# Patient Record
Sex: Female | Born: 1940 | Race: White | Hispanic: No | Marital: Married | Smoking: Current every day smoker
Health system: Southern US, Community
[De-identification: ages and names within clinical notes are randomized; demographics above are authoritative.]

## PROBLEM LIST (undated history)

## (undated) ENCOUNTER — Emergency Department (HOSPITAL_COMMUNITY): Admission: EM | Payer: Managed Care, Other (non HMO) | Source: Home / Self Care

## (undated) DIAGNOSIS — A419 Sepsis, unspecified organism: Secondary | ICD-10-CM

## (undated) DIAGNOSIS — Z720 Tobacco use: Secondary | ICD-10-CM

## (undated) DIAGNOSIS — M1990 Unspecified osteoarthritis, unspecified site: Secondary | ICD-10-CM

## (undated) DIAGNOSIS — E785 Hyperlipidemia, unspecified: Secondary | ICD-10-CM

## (undated) DIAGNOSIS — K635 Polyp of colon: Secondary | ICD-10-CM

## (undated) DIAGNOSIS — M549 Dorsalgia, unspecified: Secondary | ICD-10-CM

## (undated) DIAGNOSIS — M797 Fibromyalgia: Secondary | ICD-10-CM

## (undated) DIAGNOSIS — M4800 Spinal stenosis, site unspecified: Secondary | ICD-10-CM

## (undated) DIAGNOSIS — Z87898 Personal history of other specified conditions: Secondary | ICD-10-CM

## (undated) DIAGNOSIS — G8929 Other chronic pain: Secondary | ICD-10-CM

## (undated) DIAGNOSIS — I10 Essential (primary) hypertension: Secondary | ICD-10-CM

## (undated) DIAGNOSIS — I214 Non-ST elevation (NSTEMI) myocardial infarction: Secondary | ICD-10-CM

## (undated) DIAGNOSIS — K449 Diaphragmatic hernia without obstruction or gangrene: Secondary | ICD-10-CM

## (undated) DIAGNOSIS — Z9119 Patient's noncompliance with other medical treatment and regimen: Secondary | ICD-10-CM

## (undated) DIAGNOSIS — I480 Paroxysmal atrial fibrillation: Secondary | ICD-10-CM

## (undated) DIAGNOSIS — R6521 Severe sepsis with septic shock: Secondary | ICD-10-CM

## (undated) DIAGNOSIS — J9600 Acute respiratory failure, unspecified whether with hypoxia or hypercapnia: Secondary | ICD-10-CM

## (undated) DIAGNOSIS — I447 Left bundle-branch block, unspecified: Secondary | ICD-10-CM

## (undated) DIAGNOSIS — J9620 Acute and chronic respiratory failure, unspecified whether with hypoxia or hypercapnia: Secondary | ICD-10-CM

## (undated) DIAGNOSIS — F419 Anxiety disorder, unspecified: Secondary | ICD-10-CM

## (undated) DIAGNOSIS — E039 Hypothyroidism, unspecified: Secondary | ICD-10-CM

## (undated) DIAGNOSIS — K219 Gastro-esophageal reflux disease without esophagitis: Secondary | ICD-10-CM

## (undated) DIAGNOSIS — I428 Other cardiomyopathies: Secondary | ICD-10-CM

## (undated) DIAGNOSIS — F411 Generalized anxiety disorder: Secondary | ICD-10-CM

## (undated) DIAGNOSIS — I4891 Unspecified atrial fibrillation: Secondary | ICD-10-CM

## (undated) DIAGNOSIS — J13 Pneumonia due to Streptococcus pneumoniae: Secondary | ICD-10-CM

## (undated) DIAGNOSIS — J90 Pleural effusion, not elsewhere classified: Secondary | ICD-10-CM

## (undated) DIAGNOSIS — F17200 Nicotine dependence, unspecified, uncomplicated: Secondary | ICD-10-CM

## (undated) DIAGNOSIS — I5023 Acute on chronic systolic (congestive) heart failure: Secondary | ICD-10-CM

## (undated) HISTORY — DX: Personal history of other specified conditions: Z87.898

## (undated) HISTORY — DX: Gastro-esophageal reflux disease without esophagitis: K21.9

## (undated) HISTORY — DX: Diaphragmatic hernia without obstruction or gangrene: K44.9

## (undated) HISTORY — DX: Anxiety disorder, unspecified: F41.9

## (undated) HISTORY — PX: TUBAL LIGATION: SHX77

## (undated) HISTORY — PX: ROTATOR CUFF REPAIR: SHX139

## (undated) HISTORY — DX: Left bundle-branch block, unspecified: I44.7

## (undated) HISTORY — DX: Hyperlipidemia, unspecified: E78.5

## (undated) HISTORY — DX: Spinal stenosis, site unspecified: M48.00

## (undated) HISTORY — PX: PARTIAL HYSTERECTOMY: SHX80

## (undated) HISTORY — PX: LUMBAR LAMINECTOMY/DECOMPRESSION MICRODISCECTOMY: SHX5026

## (undated) HISTORY — DX: Other chronic pain: G89.29

## (undated) HISTORY — DX: Fibromyalgia: M79.7

## (undated) HISTORY — DX: Tobacco use: Z72.0

## (undated) HISTORY — PX: TONSILLECTOMY AND ADENOIDECTOMY: SUR1326

## (undated) HISTORY — DX: Polyp of colon: K63.5

## (undated) HISTORY — PX: OTHER SURGICAL HISTORY: SHX169

## (undated) HISTORY — PX: CERVICAL DISCECTOMY: SHX98

## (undated) HISTORY — DX: Hypothyroidism, unspecified: E03.9

## (undated) HISTORY — DX: Essential (primary) hypertension: I10

## (undated) HISTORY — DX: Unspecified osteoarthritis, unspecified site: M19.90

## (undated) HISTORY — DX: Unspecified atrial fibrillation: I48.91

## (undated) HISTORY — DX: Dorsalgia, unspecified: M54.9

## (undated) HISTORY — PX: TUMOR REMOVAL: SHX12

---

## 1997-12-15 ENCOUNTER — Ambulatory Visit (HOSPITAL_COMMUNITY): Admission: RE | Admit: 1997-12-15 | Discharge: 1997-12-15 | Payer: Self-pay | Admitting: Specialist

## 1998-03-30 ENCOUNTER — Encounter: Payer: Self-pay | Admitting: Emergency Medicine

## 1998-03-30 ENCOUNTER — Observation Stay (HOSPITAL_COMMUNITY): Admission: EM | Admit: 1998-03-30 | Discharge: 1998-04-01 | Payer: Self-pay | Admitting: Emergency Medicine

## 1998-03-31 ENCOUNTER — Encounter: Payer: Self-pay | Admitting: Cardiovascular Disease

## 2000-03-25 ENCOUNTER — Ambulatory Visit (HOSPITAL_COMMUNITY): Admission: RE | Admit: 2000-03-25 | Discharge: 2000-03-25 | Payer: Self-pay | Admitting: Specialist

## 2000-03-25 ENCOUNTER — Encounter: Payer: Self-pay | Admitting: Specialist

## 2000-04-25 HISTORY — PX: TOTAL KNEE ARTHROPLASTY: SHX125

## 2000-06-16 ENCOUNTER — Encounter (INDEPENDENT_AMBULATORY_CARE_PROVIDER_SITE_OTHER): Payer: Self-pay | Admitting: Specialist

## 2000-06-16 ENCOUNTER — Other Ambulatory Visit: Admission: RE | Admit: 2000-06-16 | Discharge: 2000-06-16 | Payer: Self-pay | Admitting: Internal Medicine

## 2000-06-26 ENCOUNTER — Ambulatory Visit (HOSPITAL_COMMUNITY): Admission: RE | Admit: 2000-06-26 | Discharge: 2000-06-26 | Payer: Self-pay | Admitting: Internal Medicine

## 2000-06-26 ENCOUNTER — Encounter: Payer: Self-pay | Admitting: Internal Medicine

## 2000-08-11 ENCOUNTER — Ambulatory Visit (HOSPITAL_COMMUNITY): Admission: RE | Admit: 2000-08-11 | Discharge: 2000-08-11 | Payer: Self-pay | Admitting: Specialist

## 2000-09-25 ENCOUNTER — Encounter: Payer: Self-pay | Admitting: Specialist

## 2000-10-02 ENCOUNTER — Inpatient Hospital Stay (HOSPITAL_COMMUNITY): Admission: RE | Admit: 2000-10-02 | Discharge: 2000-10-06 | Payer: Self-pay | Admitting: Specialist

## 2000-10-02 ENCOUNTER — Encounter: Payer: Self-pay | Admitting: Specialist

## 2000-12-28 ENCOUNTER — Encounter: Payer: Self-pay | Admitting: Family Medicine

## 2000-12-28 ENCOUNTER — Ambulatory Visit (HOSPITAL_COMMUNITY): Admission: RE | Admit: 2000-12-28 | Discharge: 2000-12-28 | Payer: Self-pay | Admitting: Family Medicine

## 2001-02-06 ENCOUNTER — Other Ambulatory Visit: Admission: RE | Admit: 2001-02-06 | Discharge: 2001-02-06 | Payer: Self-pay | Admitting: Family Medicine

## 2001-09-04 ENCOUNTER — Encounter: Admission: RE | Admit: 2001-09-04 | Discharge: 2001-09-04 | Payer: Self-pay | Admitting: Specialist

## 2001-09-04 ENCOUNTER — Encounter: Payer: Self-pay | Admitting: Specialist

## 2001-10-08 ENCOUNTER — Ambulatory Visit (HOSPITAL_COMMUNITY): Admission: RE | Admit: 2001-10-08 | Discharge: 2001-10-08 | Payer: Self-pay | Admitting: Specialist

## 2001-11-21 ENCOUNTER — Encounter: Payer: Self-pay | Admitting: Specialist

## 2001-11-21 ENCOUNTER — Inpatient Hospital Stay (HOSPITAL_COMMUNITY): Admission: RE | Admit: 2001-11-21 | Discharge: 2001-11-24 | Payer: Self-pay | Admitting: Specialist

## 2002-03-11 ENCOUNTER — Encounter: Payer: Self-pay | Admitting: Family Medicine

## 2002-03-11 ENCOUNTER — Ambulatory Visit (HOSPITAL_COMMUNITY): Admission: RE | Admit: 2002-03-11 | Discharge: 2002-03-11 | Payer: Self-pay | Admitting: Family Medicine

## 2002-03-22 ENCOUNTER — Emergency Department (HOSPITAL_COMMUNITY): Admission: EM | Admit: 2002-03-22 | Discharge: 2002-03-23 | Payer: Self-pay | Admitting: *Deleted

## 2002-03-23 ENCOUNTER — Encounter: Payer: Self-pay | Admitting: *Deleted

## 2002-10-25 ENCOUNTER — Encounter: Payer: Self-pay | Admitting: Family Medicine

## 2002-10-25 ENCOUNTER — Encounter: Admission: RE | Admit: 2002-10-25 | Discharge: 2002-10-25 | Payer: Self-pay | Admitting: Family Medicine

## 2003-06-09 ENCOUNTER — Encounter: Admission: RE | Admit: 2003-06-09 | Discharge: 2003-06-09 | Payer: Self-pay | Admitting: Specialist

## 2003-06-26 ENCOUNTER — Encounter: Admission: RE | Admit: 2003-06-26 | Discharge: 2003-06-26 | Payer: Self-pay | Admitting: Specialist

## 2003-09-04 ENCOUNTER — Ambulatory Visit (HOSPITAL_COMMUNITY): Admission: RE | Admit: 2003-09-04 | Discharge: 2003-09-04 | Payer: Self-pay | Admitting: Specialist

## 2003-10-13 ENCOUNTER — Observation Stay (HOSPITAL_COMMUNITY): Admission: RE | Admit: 2003-10-13 | Discharge: 2003-10-14 | Payer: Self-pay | Admitting: Specialist

## 2004-02-25 ENCOUNTER — Emergency Department (HOSPITAL_COMMUNITY): Admission: EM | Admit: 2004-02-25 | Discharge: 2004-02-25 | Payer: Self-pay | Admitting: Emergency Medicine

## 2004-03-11 ENCOUNTER — Ambulatory Visit: Payer: Self-pay | Admitting: Internal Medicine

## 2004-03-12 ENCOUNTER — Inpatient Hospital Stay (HOSPITAL_BASED_OUTPATIENT_CLINIC_OR_DEPARTMENT_OTHER): Admission: RE | Admit: 2004-03-12 | Discharge: 2004-03-12 | Payer: Self-pay | Admitting: Internal Medicine

## 2004-03-12 ENCOUNTER — Ambulatory Visit: Payer: Self-pay | Admitting: Internal Medicine

## 2004-03-24 ENCOUNTER — Ambulatory Visit: Payer: Self-pay | Admitting: Internal Medicine

## 2004-04-22 ENCOUNTER — Ambulatory Visit (HOSPITAL_COMMUNITY): Admission: RE | Admit: 2004-04-22 | Discharge: 2004-04-22 | Payer: Self-pay | Admitting: Family Medicine

## 2004-08-25 ENCOUNTER — Ambulatory Visit (HOSPITAL_COMMUNITY): Admission: RE | Admit: 2004-08-25 | Discharge: 2004-08-25 | Payer: Self-pay | Admitting: Family Medicine

## 2004-10-19 ENCOUNTER — Other Ambulatory Visit: Admission: RE | Admit: 2004-10-19 | Discharge: 2004-10-19 | Payer: Self-pay | Admitting: Family Medicine

## 2005-03-29 ENCOUNTER — Observation Stay (HOSPITAL_COMMUNITY): Admission: RE | Admit: 2005-03-29 | Discharge: 2005-03-30 | Payer: Self-pay | Admitting: Obstetrics & Gynecology

## 2007-01-11 ENCOUNTER — Encounter: Admission: RE | Admit: 2007-01-11 | Discharge: 2007-01-11 | Payer: Self-pay | Admitting: Family Medicine

## 2007-01-16 ENCOUNTER — Encounter: Admission: RE | Admit: 2007-01-16 | Discharge: 2007-01-16 | Payer: Self-pay | Admitting: Family Medicine

## 2007-09-03 ENCOUNTER — Ambulatory Visit: Payer: Self-pay | Admitting: Internal Medicine

## 2007-09-14 ENCOUNTER — Ambulatory Visit: Payer: Self-pay | Admitting: Internal Medicine

## 2009-12-23 ENCOUNTER — Ambulatory Visit: Payer: Self-pay | Admitting: Pulmonary Disease

## 2009-12-23 ENCOUNTER — Ambulatory Visit: Payer: Self-pay | Admitting: Cardiology

## 2009-12-23 ENCOUNTER — Inpatient Hospital Stay (HOSPITAL_COMMUNITY): Admission: EM | Admit: 2009-12-23 | Discharge: 2009-12-31 | Payer: Self-pay | Admitting: Emergency Medicine

## 2009-12-24 ENCOUNTER — Encounter: Payer: Self-pay | Admitting: Cardiology

## 2010-01-07 ENCOUNTER — Ambulatory Visit: Payer: Self-pay | Admitting: Cardiology

## 2010-01-10 LAB — CONVERTED CEMR LAB
BUN: 26 mg/dL — ABNORMAL HIGH (ref 6–23)
CO2: 29 meq/L (ref 19–32)
Chloride: 95 meq/L — ABNORMAL LOW (ref 96–112)
Creatinine, Ser: 1 mg/dL (ref 0.4–1.2)
Eosinophils Absolute: 0.3 10*3/uL (ref 0.0–0.7)
Glucose, Bld: 83 mg/dL (ref 70–99)
MCHC: 33.8 g/dL (ref 30.0–36.0)
MCV: 95.6 fL (ref 78.0–100.0)
Monocytes Absolute: 0.6 10*3/uL (ref 0.1–1.0)
Neutrophils Relative %: 58.1 % (ref 43.0–77.0)
Platelets: 290 10*3/uL (ref 150.0–400.0)
RDW: 15.6 % — ABNORMAL HIGH (ref 11.5–14.6)

## 2010-01-15 DIAGNOSIS — E785 Hyperlipidemia, unspecified: Secondary | ICD-10-CM | POA: Insufficient documentation

## 2010-01-15 DIAGNOSIS — F17200 Nicotine dependence, unspecified, uncomplicated: Secondary | ICD-10-CM

## 2010-01-15 DIAGNOSIS — K219 Gastro-esophageal reflux disease without esophagitis: Secondary | ICD-10-CM

## 2010-01-15 DIAGNOSIS — I480 Paroxysmal atrial fibrillation: Secondary | ICD-10-CM

## 2010-01-15 DIAGNOSIS — I10 Essential (primary) hypertension: Secondary | ICD-10-CM | POA: Insufficient documentation

## 2010-01-15 DIAGNOSIS — M549 Dorsalgia, unspecified: Secondary | ICD-10-CM

## 2010-01-15 DIAGNOSIS — R079 Chest pain, unspecified: Secondary | ICD-10-CM | POA: Insufficient documentation

## 2010-01-15 DIAGNOSIS — E039 Hypothyroidism, unspecified: Secondary | ICD-10-CM | POA: Insufficient documentation

## 2010-01-15 DIAGNOSIS — I447 Left bundle-branch block, unspecified: Secondary | ICD-10-CM

## 2010-01-15 DIAGNOSIS — F411 Generalized anxiety disorder: Secondary | ICD-10-CM

## 2010-01-15 DIAGNOSIS — M1990 Unspecified osteoarthritis, unspecified site: Secondary | ICD-10-CM | POA: Insufficient documentation

## 2010-01-15 HISTORY — DX: Dorsalgia, unspecified: M54.9

## 2010-01-15 HISTORY — DX: Gastro-esophageal reflux disease without esophagitis: K21.9

## 2010-01-15 HISTORY — DX: Left bundle-branch block, unspecified: I44.7

## 2010-01-15 HISTORY — DX: Hyperlipidemia, unspecified: E78.5

## 2010-01-15 HISTORY — DX: Paroxysmal atrial fibrillation: I48.0

## 2010-01-15 HISTORY — DX: Generalized anxiety disorder: F41.1

## 2010-01-15 HISTORY — DX: Nicotine dependence, unspecified, uncomplicated: F17.200

## 2010-01-17 ENCOUNTER — Telehealth: Payer: Self-pay | Admitting: Nurse Practitioner

## 2010-01-20 ENCOUNTER — Ambulatory Visit: Payer: Self-pay | Admitting: Cardiology

## 2010-01-20 DIAGNOSIS — I428 Other cardiomyopathies: Secondary | ICD-10-CM | POA: Insufficient documentation

## 2010-02-15 ENCOUNTER — Telehealth: Payer: Self-pay | Admitting: Cardiology

## 2010-02-17 ENCOUNTER — Ambulatory Visit: Payer: Self-pay | Admitting: Cardiology

## 2010-02-17 DIAGNOSIS — R5381 Other malaise: Secondary | ICD-10-CM | POA: Insufficient documentation

## 2010-02-17 DIAGNOSIS — R5383 Other fatigue: Secondary | ICD-10-CM

## 2010-02-22 ENCOUNTER — Telehealth: Payer: Self-pay | Admitting: Cardiology

## 2010-04-07 ENCOUNTER — Ambulatory Visit: Payer: Self-pay | Admitting: Cardiology

## 2010-05-16 ENCOUNTER — Encounter: Payer: Self-pay | Admitting: Physical Medicine and Rehabilitation

## 2010-05-17 ENCOUNTER — Encounter: Payer: Self-pay | Admitting: Family Medicine

## 2010-05-25 NOTE — Progress Notes (Signed)
Summary: Cardiology Phone Note - Requesting ABX  Phone Note Call from Patient Call back at Home Phone [phone]   Caller: Patient Summary of Call: Cathy Robles called today complaining of congestion and productive cough that has been getting progressively worse since hospital d/c earlier this month.  she saw her pcp last monday and was given a rx for a z pack but was advised by her pharmacist not to take it b/c of potential interaction with amio (qt prolongation).  she then went back to her pcp and was told that she didn't need abx.  she says Ss have progressed and is requesting that i call in abx.  i advised that w/ her h/o syst chf, vdrf, and copd, that if she is feeling as badly as stated, that she should present to either an urgent care, or ER for evaluation including cxr, vs, blood work, to ensure that what she is calling a cold is not anything worse.  She was somewhat disturbed that I wouldn't call in abx for her w/o seeing her and I reiterated that she should be seen given her progression of Ss.  She thanked me for my time and hung up. Initial call taken by: Creig Hines, ANP-BC,  January 17, 2010 2:36 PM

## 2010-05-25 NOTE — Assessment & Plan Note (Signed)
Summary: [facility]   Visit Type:  Follow-up Primary Provider:  Belva Agee, NP  CC:  Atrial Fibrillation.  History of Present Illness: The patient was recently hospitalized when she presented with atrial fibrillation and rapid ventricular rate. She had subsequent respiratory failure and required emergent cardioversion and intubation. Her initial ejection fraction was found to be about 25% on echo during the acute phase of this illness. She also had an enzyme elevation. However, cardiac catheterization demonstrated only nonobstructive coronary disease and her ejection fraction had improved to 40%. She was sent home on amiodarone. Unfortunately she continues to smoke cigarettes despite all this. She has had a cough with some productive sputum though she can't quantify or qualify this. She's not had any fevers or chills. She's had none of the tachycardia palpitations or acute symptoms that she had at the time of hospitalization. She gets short of breath walking a short distance on level ground but is not describing PND or orthopnea. She is not describing chest pressure, neck or arm discomfort. She was recently given a prescription for a Z-Pak for possible upper respiratory infection but was called later and told not to take this because of the amiodarone interaction.  Current Medications (verified): 1)  Amiodarone Hcl 400 Mg Tabs (Amiodarone Hcl) .Marland Kitchen.. 1 By Mouth Daily 2)  Aspirin 81 Mg  Tabs (Aspirin) .Marland Kitchen.. 1 By Mouth Daily 3)  Furosemide 40 Mg Tabs (Furosemide) .Marland Kitchen.. 1 By Mouth Two Times A Day 4)  Metoprolol Succinate 50 Mg Xr24h-Tab (Metoprolol Succinate) .Marland Kitchen.. 1 By Mouth Two Times A Day 5)  Pravachol 40 Mg Tabs (Pravastatin Sodium) .Marland Kitchen.. 1 By Mouth Daily 6)  Aldactone 25 Mg Tabs (Spironolactone) .... 1/2 By Mouth Daily 7)  Synthroid 150 Mcg Tabs (Levothyroxine Sodium) .Marland Kitchen.. 1 By Mouth Daily 8)  Alprazolam 1 Mg Tabs (Alprazolam) .... As Needed 9)  Colace 100 Mg Caps (Docusate Sodium) .Marland Kitchen.. 1 By  Mouth Daily 10)  Hydrocodone-Acetaminophen 10-325 Mg Tabs (Hydrocodone-Acetaminophen) .... As Needed 11)  Morphine Sulfate 15 Mg Tabs (Morphine Sulfate) .Marland Kitchen.. 1 By Mouth Two Times A Day 12)  Ramipril 10 Mg Caps (Ramipril) .... Hold 13)  Singulair 10 Mg Tabs (Montelukast Sodium) .Marland Kitchen.. 1 By Mouth Daily 14)  Tizanidine Hcl 2 Mg Tabs (Tizanidine Hcl) .Marland Kitchen.. 1 By Mouth Three Times A Day 15)  Vitamin D3 50000 Unit Caps (Cholecalciferol) .Marland Kitchen.. 1 By Mouth Weekly 16)  Pradaxa 150 Mg Caps (Dabigatran Etexilate Mesylate) .Marland Kitchen.. 1 By Mouth Two Times A Day 17)  Doxycycline Monohydrate 100 Mg Caps (Doxycycline Monohydrate) .... One Twice A Day For 10 Days  Allergies (verified): 1)  ! Penicillin 2)  ! Sulfa 3)  ! Codeine 4)  ! Demerol 5)  ! Levaquin 6)  ! * Methadone  Past History:  Past Medical History:  1. AFib with RVR.   2. Chronic left bundle-branch block.   3. History of chest pain.       a.     Minimal Coronary Plaque 2011.   4. Hiatal hernia.   5. Gastroesophageal reflux disease.   6. Asthma.   7. Ongoing tobacco abuse, currently smoking one pack a day.   8. Degenerative joint disease.   9. Spinal stenosis at L5-S1.   10.Chronic back pain.   11.Osteoarthritis.   12.Anxiety.   13.Hypertension.   14.Hyperlipidemia.   15.Colonic polyps.   16.Hypothyroidism.   17.Fibromyalgia.   18.History of headaches.   19. Cardiomyopathy (2011 possibly related to Afib with RVR)  Review of Systems  As stated in the HPI and negative for all other systems.   Vital Signs:  Patient profile:   70 year old female Height:      62 inches Weight:      202 pounds BMI:     37.08 Pulse rate:   60 / minute Resp:     18 per minute BP sitting:   138 / 86  (right arm)  Vitals Entered By: Marrion Coy, CNA (January 20, 2010 1:22 PM)  Physical Exam  General:  Chronically ill-appearing but in no acute distress Head:  normocephalic and atraumatic Mouth:  Edentulous Neck:  Neck supple, no JVD. No  masses, thyromegaly or abnormal cervical nodes. Chest Wall:  no deformities or breast masses noted Lungs:  Decreased breath sounds with coarse crackles and wheezes Abdomen:  Bowel sounds positive; abdomen soft and non-tender without masses, organomegaly, or hernias noted. No hepatosplenomegaly. Msk:  Diffuse muscle wasting Neurologic:  Alert and oriented x 3. Skin:  Intact without lesions or rashes. Cervical Nodes:  no significant adenopathy Psych:  Normal affect.   Detailed Cardiovascular Exam  Neck    Carotids: Carotids full and equal bilaterally without bruits.      Neck Veins: Normal, no JVD.    Heart    Inspection: no deformities or lifts noted.      Auscultation: Distant heart sounds, S1 and S2 within normal limits, no S3, soft early systolic murmur nonradiating, no diastolic murmurs  Vascular    Abdominal Aorta: no palpable masses, pulsations, or audible bruits.      Femoral Pulses: normal femoral pulses bilaterally.      Pedal Pulses: normal pedal pulses bilaterally.      Radial Pulses: diminished right radial pulse and diminished left radial pulse.     EKG  Procedure date:  01/20/2010  Findings:      sinus rhythm with left bundle branch block  Impression & Recommendations:  Problem # 1:  ATRIAL FIBRILLATION (ICD-427.31) Patient maintains her anticoagulation and amiodarone. In one month should reduce the amiodarone to 200 mg daily. Orders: EKG w/ Interpretation (93000)  Problem # 2:  TOBACCO ABUSE (ICD-305.1) She is hopelessly addicted though I have educated her as have many people. We will continue to try to gently coax her to quit smoking. Of note I do believe she has an ongoing upper respiratory infection. She agrees to hold her cigarettes while we treat this. I will give her doxycycline to avoid any drug interactions with amiodarone.  Problem # 3:  CARDIOMYOPATHY (ICD-425.4) This was probably related to her arrhythmia. I will repeat an echocardiogram in the  months to come.  Patient Instructions: 1)  Your physician recommends that you schedule a follow-up appointment:  due 03/17/2010 at 1:15pm 2)  Your physician has recommended you make the following change in your medication: Start Doxycycline 100mg  by mouth twice a day Prescriptions: DOXYCYCLINE MONOHYDRATE 100 MG CAPS (DOXYCYCLINE MONOHYDRATE) one twice a day for 10 days  #10 x 0   Entered by:   Charolotte Capuchin, RN   Authorized by:   Rollene Rotunda, MD, [facility]   Signed by:   Charolotte Capuchin, RN on 01/20/2010   Method used:   Electronically to        [facility]  [address] (retail)       [address]       [place], [place]  [address]       Ph: [phone]  Fax: [phone]   RxID:   [id number]  I have reviewed and approved all prescriptions at the time of this visit. Rollene Rotunda, MD, [facility]  January 20, 2010 5:03 PM

## 2010-05-25 NOTE — Assessment & Plan Note (Signed)
Summary: [facility]   Visit Type:  Follow-up Primary Provider:  Belva Agee, NP  CC:  Weakness and fatigue.  History of Present Illness: The patient is asked to my scheduled to discuss the above complaints. She has had increasing somnolence and fatigue. She blames on medication started when she was hospitalized earlier this year. These medications include amiodarone, Pradaxa, spironolactone, Pravachol and furosemide.  She also had dose adjustments to Synthroid.  She has been best her hypersomnolence and weakness is related to one of these medications. This is despite the fact that she also has a prescription for more pain (which she says she's not taking) is now on a muscle relaxant and hydrocodone. She says her pain medications and other drugs have been chronic. I mentioned possibly amiodarone as an etiology.  However, when I mentioned that she could go back into fibrillation coming off of this she became quite tearful. Her husband seemed to indicate that he thinks is her pain medication. She denies any chest pressure, neck or arm discomfort. She denies any palpitations, presyncope or syncope. She has had no new PND or orthopnea. She continues to smoke cigarettes. Of note she did have excessive blood work done a couple of days ago with normal labs including TSH and electrolytes.   Current Medications (verified): 1)  Amiodarone Hcl 400 Mg Tabs (Amiodarone Hcl) .... Hold 2)  Aspirin 81 Mg  Tabs (Aspirin) .Marland Kitchen.. 1 By Mouth Daily 3)  Furosemide 40 Mg Tabs (Furosemide) .... Hold 4)  Metoprolol Succinate 50 Mg Xr24h-Tab (Metoprolol Succinate) .... Hold 5)  Pravachol 40 Mg Tabs (Pravastatin Sodium) .... Hold 6)  Aldactone 25 Mg Tabs (Spironolactone) .... Hold 7)  Synthroid 150 Mcg Tabs (Levothyroxine Sodium) .... Hold 8)  Alprazolam 1 Mg Tabs (Alprazolam) .... As Needed 9)  Colace 100 Mg Caps (Docusate Sodium) .Marland Kitchen.. 1 By Mouth Daily 10)  Hydrocodone-Acetaminophen 10-325 Mg Tabs  (Hydrocodone-Acetaminophen) .... As Needed 11)  Morphine Sulfate 15 Mg Tabs (Morphine Sulfate) .Marland Kitchen.. 1 By Mouth Two Times A Day 12)  Ramipril 10 Mg Caps (Ramipril) .... Hold 13)  Singulair 10 Mg Tabs (Montelukast Sodium) .Marland Kitchen.. 1 By Mouth Daily 14)  Tizanidine Hcl 2 Mg Tabs (Tizanidine Hcl) .Marland Kitchen.. 1 By Mouth Three Times A Day 15)  Vitamin D3 50000 Unit Caps (Cholecalciferol) .Marland Kitchen.. 1 By Mouth Weekly 16)  Pradaxa 150 Mg Caps (Dabigatran Etexilate Mesylate) .... Hold  Allergies (verified): 1)  ! Penicillin 2)  ! Sulfa 3)  ! Codeine 4)  ! Demerol 5)  ! Levaquin 6)  ! * Methadone  Past History:  Past Medical History: Reviewed history from 01/20/2010 and no changes required.  1. AFib with RVR.   2. Chronic left bundle-branch block.   3. History of chest pain.       a.     Minimal Coronary Plaque 2011.   4. Hiatal hernia.   5. Gastroesophageal reflux disease.   6. Asthma.   7. Ongoing tobacco abuse, currently smoking one pack a day.   8. Degenerative joint disease.   9. Spinal stenosis at L5-S1.   10.Chronic back pain.   11.Osteoarthritis.   12.Anxiety.   13.Hypertension.   14.Hyperlipidemia.   15.Colonic polyps.   16.Hypothyroidism.   17.Fibromyalgia.   18.History of headaches.   19. Cardiomyopathy (2011 possibly related to Afib with RVR)  Past Surgical History: Reviewed history from 01/15/2010 and no changes required. 1. Tonsils and adenoids as a child. 2. Tubal ligation. 3. Partial hysterectomy. 4. "Bladder tack." 5. Right  rotator cuff repair. 6. 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 7. Lumbar decompression. 8. Left total knee in 2002. 9. Cervical diskectomy, details unknown.  Review of Systems       As stated in the HPI and negative for all other systems.   Vital Signs:  Patient profile:   70 year old female Height:      62 inches Weight:      204 pounds BMI:     37.45 Pulse rate:   54 / minute Resp:     16 per minute BP sitting:   118 / 68   (left arm)  Vitals Entered By: Marrion Coy, CNA (February 17, 2010 2:52 PM)  Physical Exam  General:  Disheveled and fatigue Head:  normocephalic and atraumatic Eyes:  PERRLA/EOM intact; conjunctiva and lids normal. Mouth:  Edentulous Neck:  Neck supple, no JVD. No masses, thyromegaly or abnormal cervical nodes. Chest Wall:  no deformities or breast masses noted Lungs:  Clear bilaterally to auscultation and percussion. Abdomen:  Bowel sounds positive; abdomen soft and non-tender without masses, organomegaly, or hernias noted. No hepatosplenomegaly. Msk:  Diffuse muscle wasting Extremities:  No clubbing or cyanosis. Neurologic:  Alert and oriented x 3. Skin:  Intact without lesions or rashes. Cervical Nodes:  no significant adenopathy Psych:  depressed affect.     Detailed Cardiovascular Exam  Neck    Carotids: Carotids full and equal bilaterally without bruits.      Neck Veins: Normal, no JVD.    Heart    Inspection: no deformities or lifts noted.      Auscultation: Distant heart sounds, S1 and S2 within normal limits, no S3, soft early systolic murmur nonradiating, no diastolic murmurs  Vascular    Abdominal Aorta: no palpable masses, pulsations, or audible bruits.      Femoral Pulses: normal femoral pulses bilaterally.      Pedal Pulses: normal pedal pulses bilaterally.      Radial Pulses: diminished right radial pulse and diminished left radial pulse.     Impression & Recommendations:  Problem # 1:  FATIGUE (ICD-780.79) Assessment Unchanged We have agreed to try first to hold her pain medications and muscle relaxants to see if she improves. If she does not then we will need to discontinue the amiodarone as potentially contributing. They understand the potential for further arrhythmias and acute decompensation such as she had before.  Problem # 2:  HYPERTENSION (ICD-401.9) Her blood pressure is controlled. She will continue the meds as listed.  Problem # 3:  TOBACCO  ABUSE (ICD-305.1) I see  multiple small burn marks on her clothes. I suspect she smoking while falling asleep.  I have discussed the dangers of this at length.  Patient Instructions: 1)  Your physician recommends that you schedule a follow-up appointment in: 1 month with Dr Antoine Poche in [place] 2)  Your physician has recommended you make the following change in your medication: Stop Hydrocodone

## 2010-05-25 NOTE — Progress Notes (Signed)
Summary: question on meds  Phone Note Call from Patient Call back at Home Phone [phone]   Caller: Patient Reason for Call: Talk to Nurse Summary of Call: pt has question on meds. pt states meds are making her sleepy and sometimes passout. pt does not know which of her meds is making her sleepy. pt is going to stop taking meds until she talk to dr. Ivy Lions or a nurse. Initial call taken by: Roe Coombs,  February 15, 2010 8:16 AM  Follow-up for Phone Call        medications causing her to pass  - can sit down on the couch after she takes it and  "goes out like a light" - "as soon as it has time to  [place]" -  States Dr Antoine Poche told her last Wednesday that he would get back in touch with her and let her know what medications to take and she has not heard from him yet.  She has not taken any of her medicaitons today and states she is not going to because she is scared to take any of it. Instructed pt that we are in clinic and I will discuss the pts concerns this afternoon after clinic with Dr Antoine Poche.  She is aware we will call her back with instructions  Follow-up by: Charolotte Capuchin, RN,  February 15, 2010 4:11 PM  Additional Follow-up for Phone Call Additional follow up Details #1::        Discussed with her husband.  She will schedule follow up. Additional Follow-up by: Rollene Rotunda, MD, [facility],  February 15, 2010 6:02 PM

## 2010-05-25 NOTE — Progress Notes (Signed)
  called pt to have her decrease her amiodarone.  she pulled bottle and states understanding to decrease it to 200mg  once a day -- Converted from flag ---- ---- 01/21/2010 9:44 AM, Charolotte Capuchin, RN wrote: Call pt to have her reduce amiodarone to 200 mg once a day around 10/28  ---- 01/20/2010 5:04 PM, Rollene Rotunda, MD, [facility] wrote: She needs to reduce her amiodarone to 200 mg in one month. ------------------------------

## 2010-05-27 ENCOUNTER — Telehealth: Payer: Self-pay | Admitting: Cardiology

## 2010-06-02 ENCOUNTER — Encounter: Payer: Self-pay | Admitting: Cardiology

## 2010-06-02 ENCOUNTER — Ambulatory Visit (INDEPENDENT_AMBULATORY_CARE_PROVIDER_SITE_OTHER): Payer: Managed Care, Other (non HMO) | Admitting: Cardiology

## 2010-06-02 DIAGNOSIS — I5022 Chronic systolic (congestive) heart failure: Secondary | ICD-10-CM

## 2010-06-02 DIAGNOSIS — I4891 Unspecified atrial fibrillation: Secondary | ICD-10-CM

## 2010-06-02 NOTE — Progress Notes (Signed)
Summary: pt needs clearence  Phone Note From Other Clinic Call back at [phone]   Caller: Lawson Fiscal from [facility] ortho  Request: Talk with Nurse, Talk with Provider Summary of Call: pt needs a clearence for selective nerve root block date and time TBA pt needs to be off pradaxa for 5days fax# [phone] Initial call taken by: Omer Jack,  May 27, 2010 10:54 AM  Follow-up for Phone Call        per Dr Antoine Poche - pt must be seen before he can give clearance or hold Pradaxa.  Left message for Lawson Fiscal of the above. Follow-up by: Charolotte Capuchin, RN,  May 27, 2010 11:20 AM

## 2010-06-10 NOTE — Assessment & Plan Note (Signed)
Summary: rov/ per pt call-mb   Visit Type:  Follow-up Primary Provider:  Belva Agee, NP  CC:  Atrial Fibrillation.  History of Present Illness: The patient presents for follow up after cancelling her last few appointments.  She is due to have back injection and needed to be cleared to come off of her medications prior to this. Since I last saw her she has had no new tachycardia palpitations. She denies any presyncope or syncope. She continues to have chronic dyspnea with mild exertion but no PND or orthopnea. She has had not any acute exacerbation of dyspnea that prompted her recent hospitalization. She has had a chronic cough. Unfortunately she continues to abuse cigarettes.  She is very limited by back pain and ambulates with a walker. She is not describing any chest pressure, neck or arm discomfort. She has had no weight gain or edema.  Current Medications (verified): 1)  Amiodarone Hcl 400 Mg Tabs (Amiodarone Hcl) .Marland Kitchen.. 1 By Mouth Daily 2)  Aspirin 81 Mg  Tabs (Aspirin) .Marland Kitchen.. 1 By Mouth Daily 3)  Furosemide 40 Mg Tabs (Furosemide) .... By Mouth Two Times A Day 4)  Metoprolol Succinate 50 Mg Xr24h-Tab (Metoprolol Succinate) .Marland Kitchen.. 1 By Mouth Two Times A Day 5)  Pravachol 40 Mg Tabs (Pravastatin Sodium) .Marland Kitchen.. 1 By Mouth Daily 6)  Aldactone 25 Mg Tabs (Spironolactone) .Marland Kitchen.. 1 By Mouth Daily 7)  Synthroid 150 Mcg Tabs (Levothyroxine Sodium) .Marland Kitchen.. 1 By Mouth Daily 8)  Alprazolam 1 Mg Tabs (Alprazolam) .... As Needed 9)  Colace 100 Mg Caps (Docusate Sodium) .Marland Kitchen.. 1 By Mouth Daily 10)  Ramipril 10 Mg Caps (Ramipril) .Marland Kitchen.. 1 By Mouth Daily 11)  Singulair 10 Mg Tabs (Montelukast Sodium) .Marland Kitchen.. 1 By Mouth Daily 12)  Tizanidine Hcl 2 Mg Tabs (Tizanidine Hcl) .Marland Kitchen.. 1 By Mouth Three Times A Day 13)  Vitamin D3 50000 Unit Caps (Cholecalciferol) .Marland Kitchen.. 1 By Mouth Weekly 14)  Pradaxa 150 Mg Caps (Dabigatran Etexilate Mesylate) .Marland Kitchen.. 1 By Mouth Two Times A Day 15)  Hydrocodone-Acetaminophen 10-325 Mg Tabs  (Hydrocodone-Acetaminophen) .... As Needed 16)  Fish Oil   Oil (Fish Oil) .Marland Kitchen.. 1 By Mouth Daily  Allergies (verified): 1)  ! Penicillin 2)  ! Sulfa 3)  ! Codeine 4)  ! Demerol 5)  ! Levaquin 6)  ! * Methadone  Past History:  Past Medical History: Reviewed history from 01/20/2010 and no changes required.  1. AFib with RVR.   2. Chronic left bundle-branch block.   3. History of chest pain.       a.     Minimal Coronary Plaque 2011.   4. Hiatal hernia.   5. Gastroesophageal reflux disease.   6. Asthma.   7. Ongoing tobacco abuse, currently smoking one pack a day.   8. Degenerative joint disease.   9. Spinal stenosis at L5-S1.   10.Chronic back pain.   11.Osteoarthritis.   12.Anxiety.   13.Hypertension.   14.Hyperlipidemia.   15.Colonic polyps.   16.Hypothyroidism.   17.Fibromyalgia.   18.History of headaches.   19. Cardiomyopathy (2011 possibly related to Afib with RVR)  Past Surgical History: Reviewed history from 01/15/2010 and no changes required. 1. Tonsils and adenoids as a child. 2. Tubal ligation. 3. Partial hysterectomy. 4. "Bladder tack." 5. Right rotator cuff repair. 6. 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 7. Lumbar decompression. 8. Left total knee in 2002. 9. Cervical diskectomy, details unknown.  Review of Systems       As  stated in the HPI and negative for all other systems.   Vital Signs:  Patient profile:   70 year old female Height:      62 inches Weight:      218 pounds BMI:     40.02 Pulse rate:   64 / minute Resp:     16 per minute BP sitting:   126 / 74  (right arm)  Vitals Entered By: Marrion Coy, CNA (June 02, 2010 3:03 PM)  Physical Exam  General:  No acute distress Head:  normocephalic and atraumatic Eyes:  PERRLA/EOM intact; conjunctiva and lids normal. Neck:  Neck supple, no JVD. No masses, thyromegaly or abnormal cervical nodes. Chest Wall:  no deformities  Lungs:  Diminished breath sounds  bilaterally without wheezing or crackles Abdomen:  Bowel sounds positive; abdomen soft and non-tender.  Unable to appreciate organomegaly the patient seated Msk:  Diffuse muscle wasting Extremities:  No clubbing or cyanosis. Neurologic:  Alert and oriented x 3. Skin:  Intact without lesions or rashes. Cervical Nodes:  no significant adenopathy Psych:  depressed affect.     Detailed Cardiovascular Exam  Neck    Carotids: Carotids full and equal bilaterally without bruits.      Neck Veins: Normal, no JVD at 90.    Heart    Inspection: no deformities or lifts noted.      Auscultation: Distant heart sounds, S1 and S2 within normal limits, no S3, soft early systolic murmur nonradiating, no diastolic murmurs  Vascular    Pedal Pulses: normal pedal pulses bilaterally.      Radial Pulses: diminished right radial pulse and diminished left radial pulse.     EKG  Procedure date:  06/02/2010  Findings:      Bradycardia, Left Bundle Branch Block, No Change from Previous  Impression & Recommendations:  Problem # 1:  ATRIAL FIBRILLATION (ICD-427.31) The patient seems to be maintaining sinus rhythm. She had been instructed to reduce her amiodarone to 200 mg daily. She misunderstood this instruction and prescription from the pharmacy and then 400 mg daily. She is not even clear written instructions to change this to 200 mg daily. She can come off of her Pradaxa (recommendation is typically 5 doses), ECASA and fish oil as needed prior to back injection  Problem # 2:  CARDIOMYOPATHY (ICD-425.4) She is on a good medical regimine and I cannot titrate the beta blocker.  She will continue the meds as listed.  Problem # 3:  TOBACCO ABUSE (ICD-305.1) I have talked to her many times about this.  She is severly addicted and does not want to quit smoking.  Problem # 4:  LBBB (ICD-426.3) This is unchanged.

## 2010-06-10 NOTE — Miscellaneous (Signed)
Summary: rx for Amiolodipine  Clinical Lists Changes  Medications: Removed medication of AMIODARONE HCL 400 MG TABS (AMIODARONE HCL) 1 by mouth daily Added new medication of AMIODARONE HCL 200 MG TABS (AMIODARONE HCL) once daily - Signed Rx of AMIODARONE HCL 200 MG TABS (AMIODARONE HCL) once daily;  #30 x 6;  Signed;  Entered by: Charolotte Capuchin, RN;  Authorized by: Rollene Rotunda, MD, [facility];  Method used: Electronically to [facility] [address], [place], [place], [place]  [address], Ph: [phone], Fax: [phone] Observations: Added new observation of PI CARDIO: Your physician recommends that you schedule a follow-up appointment in: 6 months with Dr Antoine Poche Your physician has recommended you make the following change in your medication: decrease Amiodarone to 200 mg daily (06/02/2010 15:49)    Prescriptions: AMIODARONE HCL 200 MG TABS (AMIODARONE HCL) once daily  #30 x 6   Entered by:   Charolotte Capuchin, RN   Authorized by:   Rollene Rotunda, MD, [facility]   Signed by:   Charolotte Capuchin, RN on 06/02/2010   Method used:   Electronically to        [facility]  [address] (retail)       [address]       [place], [place]  [address]       Ph: [phone]       Fax: [phone]   RxID:   [id number]    Patient Instructions: 1)  Your physician recommends that you schedule a follow-up appointment in: 6 months with Dr Antoine Poche 2)  Your physician has recommended you make the following change in your medication: decrease Amiodarone to 200 mg daily

## 2010-06-10 NOTE — Letter (Signed)
Summary: hold pradaxa, asa and fish oil  Architectural technologist at [employer]. [address]   [place], [place] [address]   Phone: [phone]  Fax: [phone]            June 02, 2010 MRN: [id number]    RE:   Cathy Robles   [address]   [place], [place]  [address]    Ok to hold your Pradaxa, ASA and fish oil prior to your surgery per Dr Rollene Rotunda.          v.o. Dr Fayrene Fearing Micharl Helmes/Pamela Fleming-Hayes, RN  This letter has been electronically signed by your physician.

## 2010-07-08 LAB — BASIC METABOLIC PANEL
BUN: 10 mg/dL (ref 6–23)
BUN: 10 mg/dL (ref 6–23)
BUN: 11 mg/dL (ref 6–23)
BUN: 11 mg/dL (ref 6–23)
BUN: 13 mg/dL (ref 6–23)
BUN: 8 mg/dL (ref 6–23)
BUN: 9 mg/dL (ref 6–23)
CO2: 25 mEq/L (ref 19–32)
CO2: 27 mEq/L (ref 19–32)
CO2: 29 mEq/L (ref 19–32)
CO2: 30 mEq/L (ref 19–32)
CO2: 31 mEq/L (ref 19–32)
Calcium: 7.9 mg/dL — ABNORMAL LOW (ref 8.4–10.5)
Calcium: 7.9 mg/dL — ABNORMAL LOW (ref 8.4–10.5)
Calcium: 9 mg/dL (ref 8.4–10.5)
Calcium: 9.6 mg/dL (ref 8.4–10.5)
Chloride: 101 mEq/L (ref 96–112)
Chloride: 105 mEq/L (ref 96–112)
Chloride: 94 mEq/L — ABNORMAL LOW (ref 96–112)
Chloride: 95 mEq/L — ABNORMAL LOW (ref 96–112)
Chloride: 96 mEq/L (ref 96–112)
Chloride: 98 mEq/L (ref 96–112)
Chloride: 98 mEq/L (ref 96–112)
Chloride: 99 mEq/L (ref 96–112)
Creatinine, Ser: 0.74 mg/dL (ref 0.4–1.2)
Creatinine, Ser: 0.75 mg/dL (ref 0.4–1.2)
Creatinine, Ser: 0.78 mg/dL (ref 0.4–1.2)
Creatinine, Ser: 0.81 mg/dL (ref 0.4–1.2)
Creatinine, Ser: 0.82 mg/dL (ref 0.4–1.2)
Creatinine, Ser: 0.83 mg/dL (ref 0.4–1.2)
Creatinine, Ser: 1 mg/dL (ref 0.4–1.2)
GFR calc Af Amer: >60 mL/min (ref >60)
GFR calc Af Amer: >60 mL/min (ref >60)
GFR calc Af Amer: >60 mL/min (ref >60)
GFR calc Af Amer: >60 mL/min (ref >60)
GFR calc non Af Amer: 55 mL/min — ABNORMAL LOW (ref >60)
GFR calc non Af Amer: >60 mL/min (ref >60)
GFR calc non Af Amer: >60 mL/min (ref >60)
GFR calc non Af Amer: >60 mL/min (ref >60)
GFR calc non Af Amer: >60 mL/min (ref >60)
Glucose, Bld: 115 mg/dL — ABNORMAL HIGH (ref 70–99)
Glucose, Bld: 130 mg/dL — ABNORMAL HIGH (ref 70–99)
Glucose, Bld: 90 mg/dL (ref 70–99)
Potassium: 3.5 mEq/L (ref 3.5–5.1)
Potassium: 3.5 mEq/L (ref 3.5–5.1)
Potassium: 4 mEq/L (ref 3.5–5.1)
Potassium: 4.4 mEq/L (ref 3.5–5.1)
Sodium: 133 mEq/L — ABNORMAL LOW (ref 135–145)
Sodium: 135 mEq/L (ref 135–145)
Sodium: 136 mEq/L (ref 135–145)
Sodium: 143 mEq/L (ref 135–145)

## 2010-07-08 LAB — BLOOD GAS, ARTERIAL
Acid-base deficit: 4.6 mmol/L — ABNORMAL HIGH (ref 0.0–2.0)
Acid-base deficit: 5.9 mmol/L — ABNORMAL HIGH (ref 0.0–2.0)
Bicarbonate: 21.2 mEq/L (ref 20.0–24.0)
Bicarbonate: 21.3 mEq/L (ref 20.0–24.0)
Drawn by: 24485
FIO2: 100 %
MECHVT: 450 mL
O2 Saturation: 98.3 %
RATE: 24 resp/min
TCO2: 21.4 mmol/L (ref 0–100)
TCO2: 22.1 mmol/L (ref 0–100)
TCO2: 22.9 mmol/L (ref 0–100)
pCO2 arterial: 28.9 mmHg — ABNORMAL LOW (ref 35.0–45.0)
pCO2 arterial: 58.3 mmHg (ref 35.0–45.0)
pH, Arterial: 7.483 — ABNORMAL HIGH (ref 7.350–7.400)
pO2, Arterial: 116 mmHg — ABNORMAL HIGH (ref 80.0–100.0)
pO2, Arterial: 136 mmHg — ABNORMAL HIGH (ref 80.0–100.0)

## 2010-07-08 LAB — CBC
HCT: 35.8 % — ABNORMAL LOW (ref 36.0–46.0)
HCT: 36.9 % (ref 36.0–46.0)
HCT: 39.7 % (ref 36.0–46.0)
Hemoglobin: 11.3 g/dL — ABNORMAL LOW (ref 12.0–15.0)
Hemoglobin: 12.6 g/dL (ref 12.0–15.0)
Hemoglobin: 12.7 g/dL (ref 12.0–15.0)
MCH: 30.5 pg (ref 26.0–34.0)
MCH: 30.7 pg (ref 26.0–34.0)
MCH: 31.8 pg (ref 26.0–34.0)
MCH: 31.8 pg (ref 26.0–34.0)
MCHC: 31.7 g/dL (ref 30.0–36.0)
MCHC: 32.9 g/dL (ref 30.0–36.0)
MCV: 96.3 fL (ref 78.0–100.0)
MCV: 97.1 fL (ref 78.0–100.0)
MCV: 97.3 fL (ref 78.0–100.0)
Platelets: 173 10*3/uL (ref 150–400)
Platelets: 184 10*3/uL (ref 150–400)
Platelets: 205 10*3/uL (ref 150–400)
Platelets: 216 10*3/uL (ref 150–400)
RBC: 3.68 MIL/uL — ABNORMAL LOW (ref 3.87–5.11)
RBC: 3.99 MIL/uL (ref 3.87–5.11)
RBC: 4.1 MIL/uL (ref 3.87–5.11)
RBC: 4.47 MIL/uL (ref 3.87–5.11)
RDW: 14.8 % (ref 11.5–15.5)
RDW: 14.9 % (ref 11.5–15.5)
RDW: 15 % (ref 11.5–15.5)
RDW: 15.2 % (ref 11.5–15.5)
WBC: 12 10*3/uL — ABNORMAL HIGH (ref 4.0–10.5)
WBC: 6.9 10*3/uL (ref 4.0–10.5)
WBC: 8.3 10*3/uL (ref 4.0–10.5)
WBC: 9.4 10*3/uL (ref 4.0–10.5)
WBC: 9.7 10*3/uL (ref 4.0–10.5)

## 2010-07-08 LAB — GLUCOSE, CAPILLARY
Glucose-Capillary: 104 mg/dL — ABNORMAL HIGH (ref 70–99)
Glucose-Capillary: 104 mg/dL — ABNORMAL HIGH (ref 70–99)
Glucose-Capillary: 106 mg/dL — ABNORMAL HIGH (ref 70–99)
Glucose-Capillary: 108 mg/dL — ABNORMAL HIGH (ref 70–99)
Glucose-Capillary: 110 mg/dL — ABNORMAL HIGH (ref 70–99)
Glucose-Capillary: 115 mg/dL — ABNORMAL HIGH (ref 70–99)
Glucose-Capillary: 119 mg/dL — ABNORMAL HIGH (ref 70–99)
Glucose-Capillary: 119 mg/dL — ABNORMAL HIGH (ref 70–99)
Glucose-Capillary: 120 mg/dL — ABNORMAL HIGH (ref 70–99)
Glucose-Capillary: 121 mg/dL — ABNORMAL HIGH (ref 70–99)
Glucose-Capillary: 129 mg/dL — ABNORMAL HIGH (ref 70–99)
Glucose-Capillary: 130 mg/dL — ABNORMAL HIGH (ref 70–99)
Glucose-Capillary: 131 mg/dL — ABNORMAL HIGH (ref 70–99)
Glucose-Capillary: 136 mg/dL — ABNORMAL HIGH (ref 70–99)
Glucose-Capillary: 136 mg/dL — ABNORMAL HIGH (ref 70–99)
Glucose-Capillary: 139 mg/dL — ABNORMAL HIGH (ref 70–99)
Glucose-Capillary: 144 mg/dL — ABNORMAL HIGH (ref 70–99)
Glucose-Capillary: 144 mg/dL — ABNORMAL HIGH (ref 70–99)
Glucose-Capillary: 165 mg/dL — ABNORMAL HIGH (ref 70–99)
Glucose-Capillary: 167 mg/dL — ABNORMAL HIGH (ref 70–99)
Glucose-Capillary: 190 mg/dL — ABNORMAL HIGH (ref 70–99)
Glucose-Capillary: 91 mg/dL (ref 70–99)

## 2010-07-08 LAB — CARDIAC PANEL(CRET KIN+CKTOT+MB+TROPI)
CK, MB: 2 ng/mL (ref 0.3–4.0)
Total CK: 64 U/L (ref 7–177)
Troponin I: 0.14 ng/mL — ABNORMAL HIGH (ref 0.00–0.06)
Troponin I: 0.24 ng/mL — ABNORMAL HIGH (ref 0.00–0.06)

## 2010-07-08 LAB — URINALYSIS, ROUTINE W REFLEX MICROSCOPIC
Bilirubin Urine: NEGATIVE
Hgb urine dipstick: NEGATIVE
Nitrite: POSITIVE — AB
Protein, ur: NEGATIVE mg/dL
Urobilinogen, UA: 0.2 mg/dL (ref 0.0–1.0)

## 2010-07-08 LAB — COMPREHENSIVE METABOLIC PANEL
BUN: 9 mg/dL (ref 6–23)
CO2: 18 mEq/L — ABNORMAL LOW (ref 19–32)
Chloride: 105 mEq/L (ref 96–112)
Creatinine, Ser: 0.81 mg/dL (ref 0.4–1.2)
GFR calc non Af Amer: >60 mL/min (ref >60)
Total Bilirubin: 0.3 mg/dL (ref 0.3–1.2)

## 2010-07-08 LAB — POCT I-STAT 3, ART BLOOD GAS (G3+)
Acid-Base Excess: 9 mmol/L — ABNORMAL HIGH (ref 0.0–2.0)
Bicarbonate: 35.1 mEq/L — ABNORMAL HIGH (ref 20.0–24.0)
TCO2: 21 mmol/L (ref 0–100)
pCO2 arterial: 52.8 mmHg — ABNORMAL HIGH (ref 35.0–45.0)
pH, Arterial: 7.27 — ABNORMAL LOW (ref 7.350–7.400)
pO2, Arterial: 64 mmHg — ABNORMAL LOW (ref 80.0–100.0)

## 2010-07-08 LAB — POCT I-STAT, CHEM 8
BUN: 11 mg/dL (ref 6–23)
Chloride: 100 mEq/L (ref 96–112)
Creatinine, Ser: 0.6 mg/dL (ref 0.4–1.2)
Potassium: 4.5 mEq/L (ref 3.5–5.1)
Sodium: 132 mEq/L — ABNORMAL LOW (ref 135–145)
TCO2: 23 mmol/L (ref 0–100)

## 2010-07-08 LAB — PROTIME-INR: Prothrombin Time: 14.6 seconds (ref 11.6–15.2)

## 2010-07-08 LAB — POCT CARDIAC MARKERS
CKMB, poc: <1 ng/mL — ABNORMAL LOW (ref 1.0–8.0)
Myoglobin, poc: 50.1 ng/mL (ref 12–200)

## 2010-07-08 LAB — LIPID PANEL
Cholesterol: 92 mg/dL (ref 0–200)
HDL: 33 mg/dL — ABNORMAL LOW (ref >39)
Total CHOL/HDL Ratio: 2.8 RATIO
Triglycerides: 111 mg/dL (ref <150)

## 2010-07-08 LAB — HEPARIN LEVEL (UNFRACTIONATED)
Heparin Unfractionated: 0.48 IU/mL (ref 0.30–0.70)
Heparin Unfractionated: 0.56 IU/mL (ref 0.30–0.70)
Heparin Unfractionated: 0.58 IU/mL (ref 0.30–0.70)

## 2010-07-08 LAB — TSH: TSH: 15.266 u[IU]/mL — ABNORMAL HIGH (ref 0.350–4.500)

## 2010-07-08 LAB — URINE MICROSCOPIC-ADD ON

## 2010-07-08 LAB — HEMOGLOBIN A1C
Hgb A1c MFr Bld: 5.7 % — ABNORMAL HIGH (ref <5.7)
Mean Plasma Glucose: 117 mg/dL — ABNORMAL HIGH (ref <117)

## 2010-07-08 LAB — URINE CULTURE
Colony Count: >=100000
Culture  Setup Time: 201108311243

## 2010-07-08 LAB — PHOSPHORUS
Phosphorus: 2.9 mg/dL (ref 2.3–4.6)
Phosphorus: 3.4 mg/dL (ref 2.3–4.6)
Phosphorus: 3.8 mg/dL (ref 2.3–4.6)

## 2010-07-08 LAB — MAGNESIUM: Magnesium: 1.5 mg/dL (ref 1.5–2.5)

## 2010-07-30 ENCOUNTER — Other Ambulatory Visit: Payer: Self-pay | Admitting: Cardiovascular Disease

## 2010-08-04 ENCOUNTER — Other Ambulatory Visit: Payer: Self-pay | Admitting: Cardiovascular Disease

## 2010-08-09 ENCOUNTER — Other Ambulatory Visit: Payer: Self-pay | Admitting: Cardiovascular Disease

## 2010-08-11 ENCOUNTER — Other Ambulatory Visit: Payer: Self-pay | Admitting: Cardiovascular Disease

## 2010-08-11 ENCOUNTER — Other Ambulatory Visit: Payer: Self-pay | Admitting: *Deleted

## 2010-08-13 ENCOUNTER — Other Ambulatory Visit: Payer: Self-pay | Admitting: Cardiovascular Disease

## 2010-08-18 ENCOUNTER — Other Ambulatory Visit: Payer: Self-pay | Admitting: Cardiovascular Disease

## 2010-08-30 ENCOUNTER — Other Ambulatory Visit: Payer: Self-pay | Admitting: Cardiovascular Disease

## 2010-08-30 ENCOUNTER — Other Ambulatory Visit: Payer: Self-pay | Admitting: Cardiology

## 2010-09-10 NOTE — Cardiovascular Report (Signed)
NAME:  Cathy Robles, Cathy Robles NO.:  [id number]   MEDICAL RECORD NO.:  [id number]          PATIENT TYPE:  OIB   LOCATION:  6501                         FACILITY:  [facility]   PHYSICIAN:  Arvilla Meres, M.D. LHCDATE OF BIRTH:  1940/10/19   DATE OF PROCEDURE:  03/12/2004  DATE OF DISCHARGE:  03/12/2004                              CARDIAC CATHETERIZATION   ADDENDUM:  Of note, the patient had multiple brief episodes of SVT which  were asymptomatic during breath hold during her catheterization.  We may  consider outpatient Holter monitor to further evaluate.       DB/MEDQ  D:  03/12/2004  T:  03/12/2004  Job:  [id number]

## 2010-09-10 NOTE — Cardiovascular Report (Signed)
NAME:  Cathy Robles, Cathy Robles NO.:  [id number]   MEDICAL RECORD NO.:  [id number]          PATIENT TYPE:  OIB   LOCATION:  6501                         FACILITY:  [facility]   PHYSICIAN:  Arvilla Meres, M.D. LHCDATE OF BIRTH:  May 20, 1940   DATE OF PROCEDURE:  03/12/2004  DATE OF DISCHARGE:                              CARDIAC CATHETERIZATION   PRIMARY CARE PHYSICIAN:  Dr. Caryl Never in [facility].   CARDIOLOGIST:  Arvilla Meres, M.D. [facility]   PATIENT ID:  Cathy Robles is a very pleasant 70 year old woman with multiple  cardiac risk factors and a recent history of progressive chest pain who  presents for cardiac catheterization to clearly define her coronary anatomy.  Access was through a 5 [place] right femoral artery approach.   PROCEDURE PERFORMED:  1.  Left heart catheterization.  2.  Left ventriculogram.  3.  Selective coronary angiography.   OPERATORS:  Arvilla Meres, M.D. Allene Dillon Janae Sauce, M.D. [facility]  (proctor).   COMPLICATIONS:  None apparent.   FINDINGS:   HEMODYNAMICS:  LV 175/15.  AO 174/74 with a mean of 113.  Left main was  normal.  Left circumflex had a small AV groove circumflex which was free of  significant coronary disease.  There was a very large branching OM-1 with no  coronary artery disease.  The ramus was a normal size vessel which was  branching.  There was no coronary artery disease.  LAD had a 25% lesion  proximally and one or two minor irregularities throughout the vessel.  There  were also two small diagonals.  There was no hemodynamically significant  coronary artery disease.  RCA was a dominant vessel with a small PDA which  was angiographically normal.   Left ventriculogram was done in the RAO approach.  Estimated EF is  approximately 55%.  Questionable mild mitral valve prolapse, but no MR.  There were no wall motion abnormalities.   ASSESSMENT/PLAN:  1.  No angiographic coronary disease.  2.  Continue primary  prevention strategies as already going on.  3.  Possible consider a gastrointestinal workup as source of chest pain      should it persist.       DB/MEDQ  D:  03/12/2004  T:  03/12/2004  Job:  [id number]

## 2010-09-10 NOTE — H&P (Signed)
[facility]  Patient:    Cathy Robles, Cathy Robles                       MRN: [id number] Attending:  Javier Docker, M.D. Dictator:   Irena Cords, P.A.-C CC:         Elvina Sidle, M.D.   History and Physical  DATE OF BIRTH:  January 16, 1941  CHIEF COMPLAINT:  Left knee pain.  HISTORY OF PRESENT ILLNESS:  [name] who has been seen at [facility] with complaints of left knee pain.  She underwent a left knee arthroscopy and debridement by Dr. Javier Docker in April of 2002.  She continued to have subsequent pain after the knee arthroscopy.  Despite conservative measures, patient continued to have pain in that knee.  She elected to undergo total left knee arthroplasty.  She was seen by her medical doctor, Dr. Elvina Sidle.  Dr. Milus Glazier felt the patient was able to successfully undergo this surgery but was concerned about some difficulties that she may have in the postoperative period due to lack of effort at rehab, complaints of pain and chronic dissatisfaction; otherwise though, he found no serious diseases diagnosed at this time.  REVIEW OF SYSTEMS:  PSYCHIATRIC:  She is alert and oriented x 3.  NEUROLOGIC: She denies reports of recent sinus headache.  Denies any numbness or tingling in the extremities.  No dizziness or light-headedness.  No seizures.  HEENT: Denies diplopia or blurred vision.  An occasional left earache.  No sore throat or rhinorrhea.  CARDIOPULMONARY:  She denies any chest pain or shortness of breath.  Reports some minimal cough with some green-black sputum. GI AND GU:  She report some dysuria.  No hematuria.  No frequency or urgency. She does believe she is starting a yeast infection and is actually starting treatment with Diflucan on September 25, 2000.  Denies any nausea, vomiting or diarrhea.  No constipation.  No abdominal pain.  MUSCULOSKELETAL:  As per the HPI.  PAST MEDICAL  HISTORY:  Significant for chronic low back pain, asthma, smoking history, gastroesophageal reflux disease, left bundle branch block on EKG, osteoarthritis, hypertension, chronic anxiety and depression, history of colon polyps, hiatal hernia.  Denies a history of heart disease, heart attacks, strokes, seizures, cancer, infectious diseases or bleeding problems.  No problems with anesthesia.  PAST SURGICAL HISTORY:  Significant for a left knee arthroscopy, colon polypectomy in January of 2002, T&A, tubal ligation, partial hysterectomy, cholecystectomy, cervical disk surgery, melanoma and right salivary tumor excision, bladder tack, lumbar surgery, right shoulder rotator cuff repair.  MEDICATIONS:  1. Aceon 4 mg q.d.  2. Zyprexa 10 mg p.o. q.h.s.  3. Nexium 40 mg p.o. b.i.d.  4. Zanaflex 4 mg p.o. b.i.d.  5. Premarin 0.9 mg p.o. q.d.  6. Effexor 75 mg p.o. b.i.d.  7. Xanax 1 mg p.o. t.i.d.  8. Singulair 10 mg p.o. q.d.  9. Celebrex 200 mg p.o. b.i.d. 10. Vicodin p.r.n. pain. 11. Triamterene hydrochlorothiazide 37.5/25 mg p.o. q.d. 12. Detrol LA 4 mg p.o. q.d. 13. Flonase. 14. Albuterol. 15. Allegra.  ALLERGIES:  PENICILLIN causing an itch and rash, SULFA causing and itch and PREDNISONE causing an itch and a sensitivity to DEMEROL causing her nausea and vomiting.  FAMILY HISTORY:  Significant for an MI and diabetes in her brother.  SOCIAL HISTORY:  She is married.  She is disabled secondary to her multiple medical problems.  She smokes  less than a pack of cigarettes a day.  Rarely does she use alcohol, about one drink every other week.  She has two children, one is healthy and well and the other died in a motor vehicle accident.  PHYSICAL EXAMINATION:  GENERAL:  This is a well-developed, well-nourished, mildly obese female in no acute distress.  VITAL SIGNS:  Temperature 98.2, pulse 72, respiratory 12 and blood pressure 120/90.  HEENT:  Head is atraumatic, normocephalic.   Pupils are equal, round and reactive to light and extraocular movements are intact.  Oropharynx is clear without redness or exudate.  TMs are clear without redness.  NECK:  Supple.  There is no cervical lymphadenopathy.  No thyromegaly.  LUNGS:  Clear to auscultation bilaterally with no wheezes or crackles.  BREASTS:  Not examined at this time, not pertinent to present illness.  GENITOURINARY:  Not examined at this time, not pertinent to present illness.  HEART:  Regular rate and rhythm with just distant heart sounds.  There are no murmurs, rubs, or gallops.  ABDOMEN:  Soft, nontender, nondistended, without masses.  There is no hepatosplenomegaly.  Positive bowel sounds.  EXTREMITIES:  She has painful range of motion of the left knee.  There is no clinical deformity.  She has 2+ DP and PT pulses.  Motor and sensory are grossly intact in both upper and lower extremities symmetrically.  SKIN:  Intact without rashes are lesions.  LABORATORY STUDIES:  CBC is essentially all within normal limits.  Mild elevation of AST at 42.  PT and PTT are within normal limits at 14.2 and 30, respectively.  CBC had an elevation of the WBC at 13.9 without shift.  H&H are 14.0 and 41.0, respectively.  Microscopic urinalysis demonstrated positive nitrite and moderate leukocytes.  There were too numerous to count wbcs and many bacteria as well as many epithelial cells.  RADIOGRAPHIC STUDIES:  X-rays demonstrated significant joint space narrowing of the medial compartment.  MRI demonstrated tricompartmental changes as well.  IMPRESSION:  1. Osteoarthritis, left knee.  2. Urinary tract infection.  3. Chronic low back pain.  4. Asthma with smoking history.  5. Gastroesophageal reflux disease.  6. Hypertension.  7. Chronic anxiety and depression.  8. Left bundle branch block on electrocardiogram.  9. History of colon polyps. 10. History of osteoarthritis.   PLAN:  Patient will be admitted on June  10th for left total knee arthroplasty by Dr. Javier Docker.  Her preoperative labs demonstrated a urinary tract infection.  Because of the finding of a UTI, we have started her on Cipro 500 mg p.o. b.i.d. for three days.  I discussed this with Dr. Shelle Iron and he is in agreement with the treatment.  We have also called in orders to preadmission for a STAT urinalysis and a CBC with differential on admission on June 10th. DD:  09/26/00 TD:  09/27/00 Job: [id number]

## 2010-09-10 NOTE — Consult Note (Signed)
NAME:  Cathy Robles, Cathy Robles                        ACCOUNT NO.:  [id number]   MEDICAL RECORD NO.:  [id number]                   PATIENT TYPE:  EMS   LOCATION:  ED                                   FACILITY:  [facility]   PHYSICIAN:  Sarita Bottom, M.D.                  DATE OF BIRTH:  04/30/1940   DATE OF CONSULTATION:  DATE OF DISCHARGE:  03/23/2002                                   CONSULTATION   PRIMARY CARE PHYSICIAN:  Elvina Sidle, M.D.   CHIEF COMPLAINT:  I'm sick.   HISTORY OF PRESENT ILLNESS:  The patient is a 70 year old lady with a  history of hypertension, anxiety disorder, and COPD.  She presents to the ER  today with a complaint of malaise, cough.  She said her cough has been going  on for four months now and has been getting worse recently.  She called her  primary M.D. who requested that she come to the emergency room for further  evaluation and admission.  She has been on so many medications for the cough  which have given her little improvement.  The cough is nonproductive.  She  denies any hemoptysis, denies any fever, denies any vomiting, denies any  palpitations.  She has bilateral chest wall tenderness.  She had an episode  of diarrhea about a week ago.  She gets shortness of breath intermittently.  Denies any dysuria.  Complains of slight lightheadedness but no dizziness.  She denies any change in her appetite.   PAST MEDICAL HISTORY:  1. COPD.  2. Arthritis.  3. Anxiety and depression.   MEDICATIONS:  Prednisone, Tessalon, Xanax, Effexor, Celebrex, Robaxin,  Avapro, Ditropan, Nexium, Vicodin, Zetia, Zyprexa, and Zyrtec.   ALLERGIES:  She says she is allergic to PENICILLIN and HYDROCODONE.   FAMILY HISTORY:  Significant for lung cancer in a lot of her relatives.   SOCIAL HISTORY:  She is married with two children.  She smokes, drinks  alcohol occasionally.   PHYSICAL EXAMINATION:  VITAL SIGNS:  Stable; she is afebrile.  GENERAL:  A middle-aged lady, not ill  looking.  NECK:  Supple.  No lymphadenopathy, no thyromegaly, no JVD.  HEENT:  Pupils are equal and reactive to light.  She is not pale.  She is  anicteric.  CHEST:  Air entry is good bilaterally.  No expiratory wheezes, no crackles.  CARDIOVASCULAR:  Heart sounds 1 and 2 normal.  No murmurs were heard.  ABDOMEN:  Obese, bowel sounds were present.  No masses or organomegaly.  NEUROLOGIC:  She is alert and oriented x3.  No focal deficits.  EXTREMITIES:  No pedal edema.  Pulses are 2+ bilaterally.   LABORATORY DATA:  Wbc 10.5, neutrophil count 51%, lymphocyte 41%, hemoglobin  12.5, hematocrit 37.5, MCV 82.5, platelet count 232.  Sodium 132, potassium  3.8, chloride 99, CO2 31, BUN 8, creatinine 0.6, glucose 106.  ABG on room  air shows pH 7.35, PCO2 44.6, PO2 76.9, bicarb 24, O2 saturation 92.7%.   ASSESSMENT:  Probably viral syndrome.   DISPOSITION:  1. The patient will be discharged on Tussionex 5 cc q.12h.  2. She will be given Tylenol 650 q.6h. p.r.n. for fever and pain.  3. The patient is advised to follow up with her primary M.D. within one     week.  4. The patient is to continue all her other regular outpatient medication.                                               Sarita Bottom, M.D.    DW/MEDQ  D:  03/23/2002  T:  03/23/2002  Job:  [id number]

## 2010-09-10 NOTE — Op Note (Signed)
NAME:  Cathy Robles, Cathy Robles NO.:  [id number]   MEDICAL RECORD NO.:  [id number]          PATIENT TYPE:  AMB   LOCATION:  SDC                           FACILITY:  [facility]   PHYSICIAN:  Ilda Mori, M.D.   DATE OF BIRTH:  [birth year]   DATE OF PROCEDURE:  03/29/2005  DATE OF DISCHARGE:                                 OPERATIVE REPORT   PREOPERATIVE DIAGNOSIS:  Rectocele and atrophic perineal body.   POSTOP DIAGNOSIS:  Rectocele and atrophic perineal body.   PROCEDURE:  Posterior repair and perineoplasty.   SURGEON:  Dr. Ilda Mori.   ASSISTANT:  Luvenia Redden, M.D.   ANESTHESIA:  General endotracheal.   ESTIMATED BLOOD LOSS:  Minimal.   SPECIMENS:  Were none.   COMPLICATIONS:  Were none.   FINDINGS:  There was a rectocele and weakening of the perineal body.   INDICATIONS:  [name] who complained of 3-4  month history of bulging in the perineal body that interfered with her  defecation. The patient complained of having to push back against the  perineal body in order to guide the stool out of the anus. Examination in  the office revealed an atrophic perineal body and a small rectocele.  These  findings were discussed with the patient and at the patient's insistence,  decision was made to proceed with surgical repair.   PROCEDURE:  The patient was taken to the operating room, placed in the  supine position. General endotracheal anesthesia was induced.  She was then  placed in the dorsal lithotomy position where the vagina and perineum were  prepped and draped in sterile fashion. The bladder was emptied. A V-shaped  incision was made in the perineal body and the skin was dissected off of the  underlying subcutaneous tissue. The skin was then dissected from the  introitus and the dissection was carried out into the dostal third of the  vagina, dissecting the mucosa free from the underlying rectum. The surgeon's  left hand was in  the rectum to identify the rectum at all times while the  dissection was taking place. After the vaginal mucosa was dissected free  from the rectum, the rectocele was isolated and the defect was closed with 3  interrupted 0 Vicryl sutures.  The dissection was then continued down to the  perineal body where a defect was also noted to be present. Again the skin  and subcutaneous tissue was dissected free and the perineal body was then  closed over the rectal defect with 0 Vicryl interrupted sutures to reinforce  the perineal body and to close the herniated areas. At this point the  surgeon's glove was removed from the rectum. The subcutaneous tissue was  further closed over the perineal body and the vaginal mucosa was reopposed  over the repaired defect in the vagina.  The vaginal mucosa was closed with running interlocking 3-0 Vicryl suture  and the perineal skin was closed with subcuticular 3-0 Vicryl suture. The  procedure was then terminated. The patient left the operative room in  excellent condition.  Ilda Mori, M.D.  Electronically Signed     RK/MEDQ  D:  03/29/2005  T:  03/30/2005  Job:  [id number]

## 2010-09-10 NOTE — H&P (Signed)
[facility]  Patient:    Cathy Robles, Cathy Robles Visit Number: [id number] MRN: [id number]          Service Type: Attending:  Javier Docker, M.D. Dictated by:   Sammuel Cooper. Mahar, P.A. Adm. Date:  10/15/01   CC:         Elvina Sidle, M.D. (in care of Dr. Ova Freshwater offic                         History and Physical  DATE OF BIRTH:  Oct 14, 1940  CHIEF COMPLAINT:  Back and lower extremity pain.  HISTORY OF PRESENT ILLNESS:  [name] who has had a long history of back pain and leg pain to the left.  She has undergone diskogram.  She does have some pathology at 3-4, 4-5, but her main problem is at L5-S1, postoperative changes, epidural fibrosis, and advanced disk degeneration in the contribution to her L3-4, 4-5.  Her diskography was negative for morphology changes at 3-4 but minor.  There is some discordant pain at 4-5, concordant pain at 5-1.  Neural foraminal narrowing was noted and facet arthropathy.  There was some facet arthropathy at L4-5.  Due to these findings and the patients failure to improve through conservative management it was thought her best option would be operative correction with a posterior lumbar interbody fusion at L5-S1.  The risks and benefits of the surgery were discussed with the patient by Dr. Jene Every.  She indicated understanding and opted to proceed.  ALLERGIES:  PENICILLIN causes a rash.  CODEINE causes nausea and vomiting. DEMEROL causes nausea and vomiting.  MEDICATIONS:  1. Celebrex 200 mg p.o. q.d.  2. Aceon 4 mg p.o. q.d.  3. Alprazolam 0.5 mg 1 tablet p.o. b.i.d. p.r.n.  4. Zetia 10 mg p.o. q.d.  5. Ditropan XL 15 mg p.o. b.i.d.  6. ______ 4 mg p.o. t.i.d.  7. Singulair 10 mg q.d.  8. Effexor XR 75 mg p.o. b.i.d.  9. Nexium 40 mg p.o. b.i.d. 10. Allegra 180 mg p.o. q.d. 11. Zyprexa 10 mg p.o. q.h.s. 12. Vitamin E 1000 IU q.d. 13. Vitamin B12 1000 mcg p.o. q.d.  PAST  MEDICAL HISTORY: 1. History of headaches including migraines, sinus, and tension headaches. 2. History of anxiety and depression. 3. Mild asthma. 4. History of pneumonia in 2002. 5. Hypertension. 6. Reflux and hiatal hernia. 7. Arthritis.  PAST SURGICAL HISTORY: 1. Tonsils and adenoids as a child. 2. Tubal ligation. 3. Partial hysterectomy. 4. "Bladder tack." 5. Right rotator cuff repair. 6. 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 7. Lumbar decompression. 8. Left total knee in 2002. 9. Cervical diskectomy, details unknown.  SOCIAL HISTORY:  The patient denies tobacco use.  Uses alcohol on a rare basis.  The patient is married, has two children.  Her husband will be available to help with her postoperative course.  He does work.  However, the patient said he is going to take a couple of days off after her surgery to help her upon returning home, and then he will be able to help her as needed throughout the day if she does call him.  FAMILY HISTORY:  Significant for diabetes mellitus, colon cancer, and osteoarthritis.  REVIEW OF SYSTEMS:  The patient denies any fevers, chills, sweats, or bleeding tendencies.  CNS:  Denies any blurred vision, double vision, headaches, seizure, or paralysis.  CARDIOVASCULAR:  Denies any chest  pain, angina, orthopnea, claudication, or palpitations.  PULMONARY:  Denies any shortness of breath, productive cough, or hemoptysis.  GASTROINTESTINAL:  Denies any nausea, vomiting, constipation, diarrhea, melena, or bloody stool. GENITOURINARY:  Denies any dysuria, hematuria, or discharge.  MUSCULOSKELETAL: As per HPI.  PHYSICAL EXAMINATION:  VITAL SIGNS:  Blood pressure 128/84, respirations 18 and unlabored, pulse 80 and regular.  GENERAL:  The patient is a 70 year old white female who is alert and oriented. In no acute distress.  Well-nourished, well-groomed.  Appears stated age. Pleasant and cooperative to exam.  HEENT:  Head  normocephalic, atraumatic.  Pupils equal, round, and reactive to light.  Extraocular movements intact.  Nares patent.  Pharynx is clear.  NECK:  Soft to palpation.  No bruits appreciated.  No lymphadenopathy noted. No thyromegaly noted.  CHEST:  Clear to auscultation bilaterally.  No rales, rhonchi, stridor, wheezes, or friction rubs.  BREASTS:  Not pertinent, not performed.  HEART:  Regular S1, S2.  Regular rate and rhythm.  No murmurs, gallops, or rubs noted.  ABDOMEN:  Soft to palpation.  Positive bowel sounds throughout.  Nontender, nondistended.  No organomegaly noted.  GENITOURINARY:  Not pertinent, not performed.  EXTREMITIES:  On exam today motor function is grossly intact distally. Sensation is grossly intact.  Neurovascularly intact.  Posterior tibialis and dorsalis pedis pulses are intact and equal bilaterally.  Per Dr. Cecile Hearing office notes from July 24, 2001, she is globally tender, global painful range of motion of the lumbar spine, tender at the lumbosacral junction.  Straight leg raising produced back pain.  Motor is 5/5.  Normal reflexes.  Sensation is intact.  SKIN:  Intact.  Without any lesions or rashes.  LABORATORY DATA:  X-ray exams are per HPI.  IMPRESSION: 1. Degenerative disk disease L5-S1, foraminal stenosis. 2. History of headaches including migraines, sinus, and tension headaches. 3. History of anxiety and depression. 4. Mild asthma. 5. History of pneumonia in 2002. 6. Hypertension. 7. Reflux and hiatal hernia. 8. Arthritis.  PLAN: 1. Admit to [facility] on October 15, 2001, for posterior lumbar    interbody fusion L5-S1 with bone grafting as needed.  This will be done by    Dr. Jene Every. 2. The patients primary care physician is Dr. Milus Glazier, and he has    forwarded [place] a letter of medical clearance indicating that she may undergo    surgery, and he is clearing her to do so.  We will contact him with a room     number and make  him aware of the surgerys completion at that time. Dictated by:   Sammuel Cooper. Mahar, P.A. Attending:  Javier Docker, M.D. DD:  10/05/01 TD:  10/08/01 Job: [id number]

## 2010-09-10 NOTE — Op Note (Signed)
Cathy Robles, Cathy Robles                       ACCOUNT NO.:  [id number]   MEDICAL RECORD NO.:  [id number]                   PATIENT TYPE:  INP   LOCATION:  0479                                 FACILITY:  [facility]   PHYSICIAN:  Javier Docker, M.D.              DATE OF BIRTH:  11/05/1940   DATE OF PROCEDURE:  11/21/2001  DATE OF DISCHARGE:  11/24/2001                                 OPERATIVE REPORT   PREOPERATIVE DIAGNOSIS:  Degenerative disk disease and spinal stenosis at L5-  S1.   POSTOPERATIVE DIAGNOSIS:  Degenerative disk disease and spinal stenosis at  L5-S1.   PROCEDURE:  Lumbar decompression L5-S1, with transformation lumbar interbody  fusion utilizing a bone allograft spacer with local cancellous bone raft,  posterolateral fusion utilizing pedicle screw instrumentation L5-S1, with  local and AlloMatrix bone graft.   ANESTHESIA:  General   SURGEON:  Javier Docker, M.D.   ASSISTANT:  Patricia Nettle, M.D.   ESTIMATED BLOOD LOSS:  200 cc.   INDICATIONS:  This is a 70 year old with the above-mentioned pathology,  discogram with concordant pain, L5 radiculopathy bilaterally, left greater  than right.  She had discordant discography at the 4-5 level.  Operative  intervention was indicated for fusion at 5-1, distraction of neural foramen,  decompression of the L5 nerve roots.  Risks and benefits discussed,  including bleeding, infection, damage to vascular structures, no change in  symptoms, worsening of symptoms, adjacent site disease, DVT, PE,  pseudoarthrosis, etc.   DESCRIPTION OF PROCEDURE:  The patient was placed in the supine position.  After satisfactory general anesthesia and 1 g Kefzol, she was placed prone  on the [place] frame with all bony prominences well-padded.  The lumbar  region was prepped and draped in the usual sterile fashion.  We used a  peripheral spinal monitoring.  An incision was made from the spinous process  of L3 to S1.  Subcutaneous tissue  was dissected, electrocautery was utilized  to achieve hemostasis.  The dorsal lumbar fascia divided in line of the skin  incision, paraspinous muscle elevated from the lamina of L5, S1, and L4.  A  fiber retractor placed.  We identified L5 and S1 with x-ray.   We first performed a TLIF on the left side.  In a TLIF-type fashion, we  prepared the lamina and the pars at L5 with an osteotome, protecting the L5  nerve root.  By mobilizing epidural fibrosis from the lamina and the  previous laminectomy site at L5-S1 on the left, we placed a hockey-stick  probe in the foramen of L5, and the foramen of L5 was found to be highly  stenotic.  There was significant epidural fibrosis bilaterally, and we felt  that a posterior lumbar interbody fusion would be inaccessible.   We took out from the pars the inferior process of the 5.  We identified L5-  S1 and the facet.  We  had taken the dissection out over the L5-S1 facet,  decapsulating the L5-S1 facet bilaterally, identifying the transverse  processes of L5 and S1.  After dissection was removed, we removed the  superior portion of the facet of S1 to the pedicle.  This further exposed  the L5 nerve root.  It was found to be fairly compressed in the foramen.  We  decorticated this.  We again decompressed this.  We  identified the disk  space with x-ray and by probing just medial and cephalad to the pedicle of  S1.   There was an osteophyte on the posterior border of the vertebral body at L5,  and the L5 root protected.  This was removed with a straight osteotome and a  pituitary.  A hockey- stick probe placed in the foramen and following this  down, the tube was patent but still stenotic.  We sequentially dilated the  disk space after inserting the dilator and converging at the appropriate  angulation to a 10 mm.  This was found to be optimal in distracting the  neural foramen.  Next through a series of curettes and pituitaries, we  performed a  thorough diskectomy and also decorticated the end plates to good  bleeding tissue and irrigated copiously.  We took an 11 x 9 x 20 allograft  TLIF graft and inserted it with the inserter, converging and then pushing  across the midline utilizing the appropriate techniques.  This was under  fluoroscopy and found to be satisfactory.  This provided [place] with excellent  distraction.   The cancellous bone that we obtained from removal of the facet and the  lamina was then placed around the cage both laterally and medially.  We  could palpate lateral to that to the cage and that was just medial to the  medial border of the pedicle, so we felt there was excellent placement of  the interbody graft.  A hockey-stick probe placed in the foramen of L5,  again found to be widely patent following this.   We next identified the pedicle of L5 at the intersection of the transverse  process of L5, revealing the pedicle in the base of the facet.  We entered  it with an awl and then a pedicle probe converging, appropriate angulation,  good cancellous bone.  This was then probed with a pedicle probe and found  to be appropriate.  Measured to 40 mm in length, confirmed with x-ray.  This  was then tapped in a similar fashion from the pedicle of S1, identifying the  foramina of S1 just cephalad and lateral to that, feeling the pedicle of S1  with the hockey-stick probe and entered it with an awl and then a pedicle  probe.  It measured 35 mm on x-ray converging.  A ball-tip probe found this  to be in cancellous bone at all times.  We tapped this as well.  We  decorticated the pars and the transverse processes and the ala and placed  local and the AlloMatrix bone graft in this lateral recess.   We then placed a 6.25 screw at the pedicle of L5 and a 7.0 screw 35 mm in  length at S1.  Excellent purchase was obtained by both.  On the contralateral side, we entered the pedicles of L5 and S1 in a similar  fashion and  decorticated similarly, inserted the screws with excellent  purchase in the AP and lateral plane and were found to be in satisfactory  position and converging.  Bone graft was placed out here laterally as well.  Placed a curved rod on the left and a straight rod on the right and put our  set screws and caps.  The [employer] system was utilized, torqued to 80  foot pounds.  Excellent purchase was noted.  Slightly compressed on the left  side prior to final tightening.  With the extent of epidural fibrosis noted  on the right, it was felt that it would be best not to manipulate the neural  elements there.   The wound was copiously irrigated and found to be satisfactory in the AP and  lateral plane.  Next a Hemovac was placed and brought out through a stab  wound in the skin.  The dorsal lumbar fascia reapproximated to the  interspinous ligament with #1 Vicryl interrupted figure-of-eight sutures.  Electrocautery was utilized to achieve strict hemostasis prior to this.  The  wound was copiously irrigated, and we examined the laminotomy.  There was no  loose debris noted.  Thrombin-soaked Gelfoam was placed there.  Interrupted  figure-of-eight sutures of #1 Vicryl were placed in the dorsal lumbar  fascia, and the subcu was closed with running 2-0 Vicryl simple sutures in a  layered fashion and the wound closed with staples.  The wound was dressed  with staples.  The wound was dressed sterilely.   The patient was placed in a hospital bed, extubated without difficulty, and  transported to the recovery room in satisfactory condition.  The patient  tolerated the procedure well with no complications.                                               Javier Docker, M.D.    JCB/MEDQ  D:  11/21/2001  T:  11/26/2001  Job:  [id number]

## 2010-09-10 NOTE — Op Note (Signed)
NAME:  Cathy Robles, Cathy Robles                        ACCOUNT NO.:  [id number]   MEDICAL RECORD NO.:  [id number]                   PATIENT TYPE:  OBV   LOCATION:  0440                                 FACILITY:  [facility]   PHYSICIAN:  Jene Every, M.D.                 DATE OF BIRTH:  01-13-1941   DATE OF PROCEDURE:  10/13/2003  DATE OF DISCHARGE:                                 OPERATIVE REPORT   PREOPERATIVE DIAGNOSIS:  Rotator cuff tear, acromioclavicular arthrosis,  left shoulder.   POSTOPERATIVE DIAGNOSIS:  Rotator cuff tear, acromioclavicular arthrosis,  left shoulder.   OPERATION/PROCEDURE:  1. Open rotator cuff repair.  2. Acromioplasty.  3. Distal clavicle resection.  4. Bursectomy.   ANESTHESIA:  General.   SURGEON:  Jene Every, M.D.   ASSISTANT:  Roma Schanz, P.A.-C.   BRIEF HISTORY:  A 70 year old with shoulder pain.  Rotator cuff tear.  AC  arthrosis.  Operative intervention was indicated for decompression of the  rotator cuff repair, distal clavicle resection and acromioplasty.  Risks and  benefits discussed including bleeding, infection, injury to vascular  structures, no change in symptoms, adhesive capsulitis, prolonged recovery,  anesthetic complications, etc.   DESCRIPTION OF PROCEDURE:  The patient supine, beach chair position.  After  induction of adequate general anesthesia and 1 g of Kefzol, the left  shoulder and upper extremity was prepped and draped in the usual sterile  fashion.  Surgical marker was utilized to delineate the [facility] joint, the  acromion.  Incision was made over the anterior aspect of the acromion.  Subcutaneous tissue was dissected by electrocautery to achieve hemostasis.  The capsule over the [facility] joint was divided.  The distal clavicle was  skeletonized with subperiosteal elevation, retracted with Hohmann  retractors.  There was severe AC arthrosis noted.  The [facility] joint was  debrided.  A small Bennett was placed to protect the rotator  cuff and  oscillating saw utilized to remove 0.5 cm of the distal clavicle.  It was  decompressed.  The anterior space, where there was a spur that had been  projected into the rotator cuff.  There was no tear there.  This was  copiously irrigated with antibiotic irrigation.  Bone wax placed upon the  distal clavicle.  I then sized the raphe between the internal and lateral  heads and the lateral heads at the deltoid and was subperiosteally elevated.  The anterior lateral heads at the deltoid from the anterior aspect of the  acromion, sized the CA ligament, performed a bursectomy, protected the  rotator cuff and with an oscillating saw, converted the acromion into a type  1 acromion.  This was further contoured with a Matt Holmes rongeur.  Soft bone was  encountered.  Hypertrophic bursa was noted and this was excised.  A small  full-thickness tear in the supraspinatus anterior leading edge was noted.  It was not, however, retracted. There was a  fair amount of degenerated  tendon there as well as in this area that was excised, about 1 cm in length,  and 3 mm in width.  I then decorticated the bone beneath it.  Good bleeding  tissue was appreciated and I repaired the cuff with #1 __________figure-of-  eight sutures.  Good range of motion without any evidence of additional  tear.  After copious irrigation with antibiotic irrigation, the repair of  the raphe with #1 Vicryl interrupted figure-of-eight sutures.  I repaired  the capsule of the [facility] joint with that as well and repaired the deltoid  trapezial fascia.  Subcutaneous tissue reapproximated with 2-0 Vicryl simple  sutures.  Skin was reapproximated with  #1 subcuticular Prolene.  Wound was  reinforced with Steri-Strips.  Sterile dressing applied.  She placed supine  on the hospital bed after an abduction pillow was placed.  Extubated without  difficulty and transported to the recovery room in satisfactory condition.  The patient tolerated the  procedure well.  No complications.                                               Jene Every, M.D.    Cordelia Pen  D:  10/13/2003  T:  10/13/2003  Job:  [id number]

## 2010-09-10 NOTE — Op Note (Signed)
[facility]  Patient:    Cathy Robles, Cathy Robles                     MRN: [id number] Proc. Date: 08/11/00 Adm. Date:  [id number] Disc. Date: [id number] Attending:  Pierce Crane                           Operative Report  PREOPERATIVE DIAGNOSES:  Medial meniscal tear, degenerative joint disease right knee.  POSTOPERATIVE DIAGNOSES:  Medial meniscal tear, degenerative joint disease right knee, grade 3 chondromalacia of patella, femoral sulcus, medial tibial plateau, medial femoral condyle.  PROCEDURE:  Left knee arthroscopy, chondroplasty of patella, medial femoral condyle, medial tibial plateau, partial medial meniscectomy.  ANESTHESIA:  General.  BRIEF HISTORY AND INDICATION:  A 70 year old with refractory knee pain, MRI indicating meniscal tear and degenerative changes.  Operative intervention indicated for debridement, partial medial meniscectomy to improve symptoms and try to avoid a total knee arthroplasty.  Risks and benefits discussed including bleeding, infection, damage to surrounding structures, DVT, no change in symptoms, need for a total knee arthroplasty.  DESCRIPTION OF PROCEDURE:  The patient is in supine position.  After the induction of adequate general anesthesia, 1 gram of Kefzol.  The left lower extremity was prepped and draped in the usual sterile fashion.  Superior medial and a lateral peripatella port was fashioned with a #11 blade.  Ingress cannula atraumatically placed and ______ was utilized to insufflate the joint.  Under direct visualization, the medial peripatella portals localized with 18 gauge needle and fashioned with a #11 blade.  Resection revealed patella femoral arthrosis with significant femoral patella femoral tracking, hypertrophic synovium.  Examination of the medial compartment with extensive changes created through the femoral condyle and tibial plateau.  There was complex tear of the posterior horn of the  medial meniscus.  The basket rongeur was introduced after the medial peripatella portal was fashioned, performed a partial medial meniscectomy to its stable base.  Next a chondroplasty performed of the tibial plateau and femoral condyle and femoral sulcus.  ACL was minimally defrayed.  Lateral compartment was with slight degenerative changes. No evidence of a tear.  Chondroplasty performed of the patella and of the sulcus.  There was normal patellar femoral tracking.  No osteophytes noted of the medial compartment as well.  Next, a probe was utilized to reveal no cartilaginous debris, residual tearing.  The knee was copiously lavaged.  The gutters were examined and unremarkable.  All instrumentation was removed. Portals were closed with 4-0 nylon simple sutures.  The wound was dressed sterilely after 0.25% Marcaine with epinephrine was infiltrated into the joint.  The patient was awakened without difficulty and transported to recovery room in satisfactory condition.  The patient tolerated the procedure well, and there were no apparent complications. DD:  08/11/00 TD:  08/12/00 Job: [id number]

## 2010-09-10 NOTE — Op Note (Signed)
[facility]  Patient:    Cathy Robles, Cathy Robles                  MRN: [id number] Proc. Date: 10/02/00 Adm. Date:  [id number] Attending:  Pierce Crane                           Operative Report  PREOPERATIVE DIAGNOSIS:  Degenerative joint disease, left knee.  POSTOPERATIVE DIAGNOSIS:  Degenerative joint disease, left knee.  PROCEDURE:  Left total knee arthroplasty.  ANESTHESIA:  General.  ASSISTANT:  Dr. Simonne Come.  BRIEF HISTORY:  This is a 70 year old female with refractory osteoarthrosis of the left knee despite arthroscopic debridement, intra-articular corticosteroid injections, radiographs demonstrated end-stage osteoarthrosis particularly of the lateral compartment. Operative intervention was indicated for replacement of the degenerated joint. The risks and benefits were discussed including bleeding, infection, damage to neurovascular structures, component failure, need for revision in the future, DVT, PE, etc.  DESCRIPTION OF PROCEDURE:  The patient in the supine position after the induction of adequate general anesthesia and 1 gm of Kefzol, the left lower extremity was prepped and draped and exsanguinated in a sterile fashion with the thigh tourniquet inflated to 250 mmHg. Through an anterior approach in the midline, an incision was made through the skin only. The subcutaneous tissue was sharply dissected. Electrocautery was utilized to achieve hemostasis. A median parapatellar arthrotomy was performed. The patella was everted, knee was flexed. Examination revealed tricompartmental osteoarthrosis particularly of the lateral compartment. Hypertrophic synovitis was noted. Le[place]ell rongeur was utilized to remove excess osteophytes from the tibia, femur, patella and removed hypertrophic synovium. Next, the femoral canal was entered with a step drill and irrigated. The intermedullary guide placed 5 degrees left. A distal femoral cutting jig  was then applied 10 mm and then resected from the distal femur. This was then removed. A sizing block was then attached and sized optimally to a #5 trial osteonics. The distal femoral cutting jig was then applied and secured with pins. With the neural elements, the vascular elements protected, the anterior and posterior cuts were then performed followed by the chamfer cuts. Next, his block was then removed, tibia subluxed, smooth with McKale. The PCL was found to be intact, remnant of the ACL removed. The base plate applied, a 5 trial was optimal. The bushing step drill was then applied. The proximal tibia drilled, T handle reamer inserted. I did not progress to the full extent of the tibia after multiple attempts. Therefore the external alignment guide was utilized, placed at the femur. External alignment guide parallel to the tibia, secured 4 mm from the defect medially. The proximal tibial guide was then packed and secured. The jig dissected the malleolar and the ankle. External jig was then applied with the tissue well protected. The oscillating saw was utilized to perform a proximal tibial cut preserving the PCL. Prior to this, a medial release was performed. The tibial cut was then fine tuned. A trial femur and tibia plus 12 was placed and found to be stable throughout a full range, no lift off, 140 degrees of flexion, no instability or obvious stressing. Next, all trials were removed, patella was everted and sized to a 26. The thickness was 23 mm. Patella clamp applied, holes for the pegs drilled. Redundant bone removed. Next the tibia was subluxed, McCales placed, bushing and tray secured to the tibia and the punch fin was utilized to recess for the permanent prosthesis. This  was then removed. Prior to this with the trial components placed then flexion extension and rotation of the tibial tray was marked with a surgical marker and the punch fin was utilized after the appropriate  rotation of the tibial tray was placed and the tray secured. The wound was copiously irrigated. Next the tibia was subluxed and dried. The medial and lateral meniscus had been removed previously. The tibia was dried, stent was placed in the proximal tibia. A #5 tray was then impacted, redundant cement removed. A plus 12 tray placed. The cement was placed on the distal femur and the runners of the component, femoral components impacted, redundant cement removed and excellent fit was noted. The knee was then reduced, axial load applied, redundant cement removed, patella everted dry. The patella was cemented with the appropriate C clamp. Appropriate curing to the cement was allowing. Trial removed. Inspection revealed some redundant cement which was then removed. It was found to fit optimally as a 15. There was some laxity in the PCL that was noted. ______ PCL retaining and the bevel recessed where the patellofemoral joint was performed previously. Fifteen filled optimally with full extension, flexion to 140 degrees, good stability and varus valgus stressing. Hemovac was placed and brought out through a lateral stab wound in the skin. Synovium closed as was the patella arthrotomy with the #1 Vicryl interrupted figure-of-eight sutures. Prior to this, a lateral release was performed to optimize patellofemoral tracking. Next the subcutaneous tissue was reapproximated with 2-0 Vicryl simple sutures, the skin was reapproximated with staples. The wound was dressed sterilely. Hemovac was ______. A 0.25% Marcaine with epinephrine was infiltrated in the joint. Prior to removing the trials interrupted radiograph was obtained demonstrating satisfactory alignment. After the dressing was applied, the tourniquet was deflated and there was adequate vascularization of the lower extremity appreciated.  The patient tolerated the procedure well with no complications. Blood loss was 150 cc. DD:  10/02/00 TD:   10/02/00 Job: [id number]

## 2010-09-10 NOTE — Discharge Summary (Signed)
[facility]  Patient:    Cathy Robles, Cathy Robles                  MRN: [id number] Adm. Date:  [id number] Disc. Date: 10/06/00 Attending:  Pierce Crane Dictator:   Nettie Elm, M.D. CC:         Javier Docker, M.D.  Elvina Sidle, M.D.  Rollene Rotunda, M.D. [facility]   Discharge Summary  ADMISSION DIAGNOSES:  1. Degenerative joint disease left knee.  2. Urinary tract infection.  3. Chronic low-back pain.  4. Asthma.  5. Smoking history.  6. Gastroesophageal reflux disease.  7. Hypertension.  8. Chronic anxiety.  9. Depression. 10. Left bundle branch block on electrocardiogram. 11. History of colonic polyps. 12. Osteoarthritis.  DISCHARGE DIAGNOSES:  1. Degenerative joint disease left knee, status post left total knee     replacement arthroplasty.  2. Mild postoperative anemia.  3. Postoperative hypokalemia, improved.  4. Postoperative hyponatremia, improved.  5. Urinary tract infection resolved.  6. Chronic low-back pain.  7. Asthma.  8. Smoking history.  9. Gastroesophageal reflux disease. 10. Hypertension. 11. Chronic anxiety. 12. Depression. 13. Left bundle branch block on electrocardiogram. 14. History of colonic polyps. 15. Osteoarthritis.  PROCEDURE:  The patient was taken to the OR on October 02, 2000 and underwent a left total knee replacement arthroplasty; surgeon Dr. Shelle Iron, assistant Dr. Simonne Come, under general anesthesia.  CONSULTATIONS:  Rehabilitation services, Dr. Hermelinda Medicus.  HISTORY OF PRESENT ILLNESS:  This is a 70 year old female who has been seen at the [facility] for left knee pain.  She underwent left knee arthroscopy in the past, in April 2002; however, has had subsequent pain and continued discomfort due to her left knee.  She has been treated in the past conservatively, up to the point where she underwent her left knee arthroscopy. Due to the significant findings and the lack of  improvement with conservative measures, it is felt that she would benefit from undergoing left total knee replacement arthroplasty.  The patient has been seen in consultation by Dr. Elvina Sidle and felt the patient would be able to undergo procedure. She was subsequently admitted to the hospital for surgery.  LABORATORY DATA:  CBC on admission:  Hemoglobin 14.0, hematocrit 41.0, white cell count 13.9, red cell count 4.38.  Serial H&H were followed throughout the hospital course.  Hemoglobin dropped postoperatively down to 11 and 11.6 and hematocrit of 33.4.  Last noted H&H was 10.4 and 30.1.  PT/PTT on admission were 14.2 and 30, respectively.  Serial pro times followed per Coumadin protocol.  Last noted PT/INR prior to discharge 18.7 and 1.9, respectively. Chemistry panel on admission:  Slightly elevated total protein of 8.1, slightly elevated AST of 42.  Remaining chemistry panel all within normal limits.  Serial BMETs were followed.  The patient had a slight drop in sodium from 142 down to 132; however, came back up to 136.  She also had a drop postoperatively of her potassium from 4.6 down to 2.9.  She responded well and her potassium came back up to 3.8 prior to discharge.  Glucose ranged from 114 up to 145.  It was back down to 117.  Urinalysis preoperatively did show a urinary tract infection.  She was treated outpatient prior to her admission. Followup UA on admission was negative.  X-rays:  Left knee films dated September 25, 2000 show osteoarthritis, most severe in the medial and lateral compartments.  Postoperative knee film on October 02, 2000:  Satisfactory left total knee replacement.  EKG dated June 14, 2000:  Normal sinus rhythm and a left bundle branch block.  This confirmed EKG, unable to read signature.  HOSPITAL COURSE:  The patient was admitted to [facility], taken to the OR, underwent the above-stated procedure without complications.  The patient tolerated  the procedure well and was later returned to the recovery room and to the orthopedic floor for continued postoperative care.  Vital signs were followed.  The patient was placed on PC analgesics for pain control following surgery.  She was later weaned off her p.o. analgesics  throughout the hospital course.  Unfortunately, she was started on morphine PCA; however, did develop some itching.  This was later changed over the PCA Dilaudid.  Once her pain had improved, she was weaned over from the PCA over to p.o. analgesics.  Labs were followed very closely.  She did have a drop in her hemoglobin; however, did not require transfusion.  She was noted to have a drop in her sodium, which was felt to be due to some positive fluid balance. She did undergo diuresis and responded well.  Her sodium did improve. Unfortunately, however, with the diuresis she did have a drop in her potassium postoperatively.  She was placed on p.o. potassium.  Her potassium was slow to come back into a normal limit.  She was given three runs of potassium and continued on oral supplementation.  She responded well and her potassium came back up to 3.8.  Physical therapy was consulted postoperatively to assist with gait training ambulation.  The patient was initially slow to progress with physical therapy, only ambulating approximately 10 feet by postop day #1; however, progressed very well and was ambulating greater than 200 feet by postop day #3.  Discharge planning assisted with post-hospital care.  The patient was going to be followed by [place] on an outpatient basis.  Home health PT, home health nursing was arranged.  Once her potassium came back up and she was stable after surgery, she was discharged home.  DISPOSITION:  The patient was discharged home on October 06, 2000.  DISCHARGE DIAGNOSIS:  Please see above.  DISCHARGE MEDICATIONS:  1. K-Dur 20 mEq daily. 2. Trinsicon 1 p.o. b.i.d. 3. Vicodin 5 mg 1-2 every  4-6 hours as needed for pain. 4. Robaxin 500 mg 1 every 6-8 hours p.r.n. spasms. 5. She was also placed on Coumadin protocol on an outpatient basis.  DIET:  Low-sodium diet.  ACTIVITY:  She is weightbearing as tolerated to the left lower extremity. Home health PT, home health nursing for total knee protocol, and Coumadin protocol.  DISCHARGE INSTRUCTIONS:  She is to continue her home medications except the Premarin, which is stopped due to the increase in risk of DVT.  FOLLOW-UP:  The patient is to follow up with Dr. Shelle Iron in two weeks.  Call the office for an appointment.  She also has a scheduled appointment with Dr. Milus Glazier on Monday at 10 a.m.  We will recommend that she have her potassium rechecked at that time.  CONDITION ON DISCHARGE:  Improved.DD:  10/06/00 TD:  10/06/00 Job: [id number]

## 2010-09-10 NOTE — Discharge Summary (Signed)
NAME:  Cathy Robles, Cathy Robles                        ACCOUNT NO.:  [id number]   MEDICAL RECORD NO.:  [id number]                   PATIENT TYPE:  INP   LOCATION:  0479                                 FACILITY:  [facility]   PHYSICIAN:  Javier Docker, M.D.              DATE OF BIRTH:  19-May-1940   DATE OF ADMISSION:  11/21/2001  DATE OF DISCHARGE:  11/24/2001                                 DISCHARGE SUMMARY   PRIMARY DIAGNOSES:  1. Degenerative disk disease and spinal stenosis at L5-S1.  2. Hyponatremia.   SECONDARY DIAGNOSES:  1. History of chronic cough.  2. Asthma.  3. Tobacco abuse.  4. Hiatal hernia and gastroesophageal reflux disease.   PROCEDURE:  Lumbar decompression of L5-S1 with lumbar interbody fusion with  bone allograft, spacer, and local cancellous bone by Dr. Shelle Iron with the  assistance of Dr. Noel Gerold on 11/21/01.  Please see operative summary for  further details.   CONSULTATIONS:  None.   LABORATORY DATA:  Preoperative hemoglobin and hematocrit were 13.6 and 39.6,  respectively.  This reached a low hemoglobin of 10.7 on 11/24/01.  Routine  chemistry preoperatively was all within normal limits with a BUN and  creatinine of 10 and 0.8, respectively.  Sodium and potassium were 139 and  3.7, respectively.  On 11/23/01, sodium dipped to 134.  Creatinine remained  within normal limits, as well as the potassium.  Urinalysis on 11/13/01,  showed trace leukocyte esterase, negative nitrites, 32 white blood cells,  and few bacteria.  Repeat on 11/23/01, was all negative.  X-rays  postoperatively showed status post fusion at the L5-S1 level.   CHIEF COMPLAINT:  Low back pain.   HISTORY OF PRESENT ILLNESS:  [name] who has had  worsening lower back pain with a L5 radiculopathy bilaterally, left greater  than right.  She had a discogram which was concordant with her pain.  Because of the lack of improvement with conservative measures and her  finding on  examination and radiographic studies, she has elected to undergo  surgical intervention.   HOSPITAL COURSE:  Following the surgical procedure, the patient was taken to  PACU in stable condition, transferred to the orthopaedic floor in good  condition.  She did well during her postoperative stay.  She received  routine postoperative pain medications and antibiotics.  Hemoglobin remained  stable postoperatively, she did not require any blood transfusions.  She was  treated by discontinuation of her IV fluids for her hyponatremia.  Wound was  examined on postoperative day #2, and subsequently found to be clean, dry,  and intact without signs or symptoms of infection.  After surgery, she  reports that the left leg pain had improved since surgery.  She was up in  physical therapy, progressing well.  By postoperative day #3 on 11/24/01, she  was stable and ready for discharge home.  She had ambulated over 200  feet  and had a bowel movement.   DISPOSITION:  The patient is being discharged home.   DIET:  As tolerated.   ACTIVITY:  Once daily dressing change to the back.  Okay to shower on  postoperative day #5.  No bending, stooping, squatting, or lifting more than  5 pounds.  Use the brace when out of bed.   FOLLOWUP:  She is to follow up with Dr. Shelle Iron in about 10 days.  Call [phone] for an appointment.   DISCHARGE MEDICATIONS:  1. Vicodin one q.4-6h. p.r.n. pain.  2. Robaxin 500 mg one q.6-8h. p.r.n. spasm.  3. She is to resume her home medications.   CONDITION ON DISCHARGE:  Good and improved.      Cathy Robles, P.A.-C.              Javier Docker, M.D.    ADT/MEDQ  D:  12/20/2001  T:  12/20/2001  Job:  [id number]

## 2010-10-13 ENCOUNTER — Telehealth: Payer: Self-pay | Admitting: Cardiology

## 2010-10-13 NOTE — Telephone Encounter (Signed)
Pt went to her PCP on yesterday, stated she has fluid around her lungs, pt was prescribe  ditrofloxacn 500 mg tab. Pt was told by her pharmacy that this med's will interact with her other med's.

## 2010-10-13 NOTE — Telephone Encounter (Signed)
Pt concerned because her PCP gave her a RX for Cipro but after picking it up from the pharmacy she was told there was an interaction and that she should not take it without checking with her cardiologist first.  After speaking with Weston Brass, pharmacist the only medication there may be an interaction with would be amiodarone.  The pt does not have a history of prolonged Q-T and therefore should be fine to take Cipro as ordered.  Instructed pt and requested her to call her PCP back if she feels uncomfortable taking it.  She stating understanding and that she did not wish to contact her PCP.

## 2010-11-25 ENCOUNTER — Encounter: Payer: Self-pay | Admitting: Cardiology

## 2010-12-08 ENCOUNTER — Ambulatory Visit: Payer: Managed Care, Other (non HMO) | Admitting: Cardiology

## 2010-12-13 ENCOUNTER — Telehealth: Payer: Self-pay | Admitting: Cardiology

## 2010-12-13 NOTE — Telephone Encounter (Signed)
Per pt call, pt accidentally missed her appt w/ Dr. Antoine Poche last week. Pt said she has been having pain around her heart for about 1 week. Pt is also c/o sob and dizziness. Pt said she can hardly walk from her living room to her bedroom without getting out of breath. Pt appt was rescheduled with Dr. Antoine Poche in [place] for September 19th. However pt wanted to know if she could be "squeezed" in any sooner. Please return pt call to advise/discuss.

## 2010-12-13 NOTE — Telephone Encounter (Signed)
Per pt t/c  - states she forgot her appt with Dr Antoine Poche last week, she didn't get a reminder call and her husband didn't turn the page on her calendar.  Complains she has been having chest pain radiating down left arm with dizziness and fatigue.  She wants to be seen in the [place] office I explained to pt that we are not in [place] again for 2 weeks and why these s/s she needs to go to the ED for evaluation.   Pt states she doesn't want to do this because it will just be another bill.  Suggested she be seen at [facility] but she states she doesn't want to do that either.  Pt will keep her appt as scheduled with Dr Antoine Poche as scheduled.  She will report to the ED if she continues to have s/s.

## 2011-01-10 ENCOUNTER — Encounter: Payer: Self-pay | Admitting: Cardiology

## 2011-01-12 ENCOUNTER — Ambulatory Visit: Payer: Managed Care, Other (non HMO) | Admitting: Cardiology

## 2011-01-22 ENCOUNTER — Other Ambulatory Visit: Payer: Self-pay | Admitting: Cardiology

## 2011-02-11 ENCOUNTER — Encounter: Payer: Self-pay | Admitting: Cardiology

## 2011-02-27 ENCOUNTER — Other Ambulatory Visit: Payer: Self-pay | Admitting: Cardiology

## 2011-03-11 ENCOUNTER — Encounter: Payer: Self-pay | Admitting: Cardiology

## 2011-03-23 ENCOUNTER — Ambulatory Visit (INDEPENDENT_AMBULATORY_CARE_PROVIDER_SITE_OTHER): Payer: Managed Care, Other (non HMO) | Admitting: Cardiology

## 2011-03-23 ENCOUNTER — Encounter: Payer: Self-pay | Admitting: Cardiology

## 2011-03-23 DIAGNOSIS — I10 Essential (primary) hypertension: Secondary | ICD-10-CM

## 2011-03-23 DIAGNOSIS — F17200 Nicotine dependence, unspecified, uncomplicated: Secondary | ICD-10-CM

## 2011-03-23 DIAGNOSIS — R079 Chest pain, unspecified: Secondary | ICD-10-CM

## 2011-03-23 DIAGNOSIS — I428 Other cardiomyopathies: Secondary | ICD-10-CM

## 2011-03-23 DIAGNOSIS — I4891 Unspecified atrial fibrillation: Secondary | ICD-10-CM

## 2011-03-23 NOTE — Assessment & Plan Note (Signed)
Her last ejection fraction was 25% on echo in 2011. She's on appropriate medications and has no overt symptoms. This may have been related to her fibrillation with rapid rate. At this point she can continue with medical management. No change in therapy is indicated.

## 2011-03-23 NOTE — Assessment & Plan Note (Signed)
She has had atypical chest pain with nonobstructive coronary disease on catheterization 2011. No change in therapy is indicated.

## 2011-03-23 NOTE — Assessment & Plan Note (Signed)
Her blood pressure is very slightly elevated today. She otherwise reports that it is well controlled. She will continue on the meds as listed.

## 2011-03-23 NOTE — Assessment & Plan Note (Signed)
We have talked about this many times. She voices some plan to quit smoking but I doubt that this will happen. She does not want prescription treatment for this.

## 2011-03-23 NOTE — Patient Instructions (Addendum)
Follow up in 6 months with Dr Antoine Poche.  You will receive a letter in the mail 2 months before you are due.  Please call us when you receive this letter to schedule your follow up appointment.  The current medical regimen is effective;  continue present plan and medications.  Please have a TSH and hepatic panel at your next blood draw

## 2011-03-23 NOTE — Progress Notes (Signed)
HPI The patient presents for followup of her cardiomyopathy felt to be related to atrial fibrillation with a rapid rate. She has missed many appointments. Since I last saw her she has had no new cardiovascular complaints. She has chronic back pain. She ambulates slowly with a walker. She denies any new chest pressure, neck or arm discomfort. She has no new shortness of breath, PND or orthopnea. She doesn't notice any palpitations, presyncope or syncope. She's had no weight gain or edema. She continues to smoke cigarettes. She complains of right upper quadrant abdominal discomfort which has been chronic and of unclear etiology.  Allergies  Allergen Reactions  . Codeine     REACTION: itch with large doses  . Levofloxacin     REACTION: itch  . Meperidine Hcl     REACTION: nausea  . Methadone     REACTION: ? confusion  . Penicillins     REACTION: hives  . Sulfonamide Derivatives     REACTION: nausea,vomiting,itching    Current Outpatient Prescriptions  Medication Sig Dispense Refill  . ALPRAZolam (XANAX) 1 MG tablet Take by mouth as needed.        Marland Kitchen aspirin 81 MG tablet Take 81 mg by mouth daily.        Marland Kitchen docusate sodium (COLACE) 100 MG capsule Take 100 mg by mouth daily.        . Fish Oil OIL Take 1 capsule by mouth daily.        . furosemide (LASIX) 40 MG tablet TAKE ONE TABLET BY MOUTH TWICE DAILY  60 tablet  6  . HYDROcodone-acetaminophen (NORCO) 10-325 MG per tablet Take by mouth as needed.        Marland Kitchen levothyroxine (SYNTHROID, LEVOTHROID) 150 MCG tablet TAKE ONE TABLET BY MOUTH EVERY DAY  30 tablet  6  . metoprolol (TOPROL-XL) 50 MG 24 hr tablet TAKE ONE TABLET BY MOUTH TWICE DAILY  60 tablet  6  . PACERONE 200 MG tablet TAKE ONE TABLET BY MOUTH EVERY DAY  30 each  1  . PRADAXA 150 MG CAPS TAKE ONE CAPSULE BY MOUTH EVERY 12 HOURS  60 capsule  6  . pravastatin (PRAVACHOL) 40 MG tablet TAKE ONE TABLET BY MOUTH AT BEDTIME  30 tablet  6  . ramipril (ALTACE) 10 MG capsule TAKE ONE  CAPSULE BY MOUTH EVERY DAY  30 capsule  6  . spironolactone (ALDACTONE) 25 MG tablet TAKE 1/2 TABLET BY MOUTH ONCE DAILY  30 tablet  6    Past Medical History  Diagnosis Date  . Atrial fibrillation with rapid ventricular response   . LBBB (left bundle branch block)     Chronic  . History of chest pain     Minimal Coronary Plaque 2011  . Hiatal hernia   . GERD (gastroesophageal reflux disease)   . Asthma   . Current tobacco use     Currently smoking 1 pack per day  . DJD (degenerative joint disease)   . Spinal stenosis     At L5-S1  . Chronic back pain   . Osteoarthritis   . Anxiety   . Hypertension   . Hyperlipidemia   . Colon polyps   . Hypothyroidism   . Fibromyalgia   . History of headache   . Cardiomyopathy 2011    Possibly related to Afib with RVR    Past Surgical History  Procedure Date  . Tonsillectomy and adenoidectomy     As a child  . Tubal ligation   .  Partial hysterectomy   . Bladder tack   . Rotator cuff repair     Right  . 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 Lumbar laminectomy/decompression microdiscectomy     Lumbar decompression  . Total knee arthroplasty 2002    Left  . Cervical discectomy     Details unknown    ROS:  As stated in the HPI and negative for all other systems.  PHYSICAL EXAM BP 146/72  Pulse 76  Ht 5\' 1"  (1.549 m)  Wt 215 lb (97.523 kg)  BMI 40.62 kg/m2 PHYSICAL EXAM GEN:  No distress, chronically ill appearing and groggy, she looks much older than her stated age NECK:  No jugular venous distention at 90 degrees, waveform within normal limits, carotid upstroke brisk and symmetric, no bruits, no thyromegaly LYMPHATICS:  No cervical adenopathy LUNGS:  Clear to auscultation bilaterally BACK:  No CVA tenderness CHEST:  Unremarkable HEART:  S1 and S2 within normal limits, no S3, no S4, no clicks, no rubs, soft apical systolic murmur ABD:  Positive bowel sounds normal in frequency in pitch, no bruits, no  rebound, no guarding, unable to assess midline mass or bruit with the patient seated. EXT:  2 plus pulses throughout, no edema, no cyanosis no clubbing SKIN:  No rashes no nodules NEURO:  Cranial nerves II through XII grossly intact, motor grossly intact throughout PSYCH:  Cognitively intact, oriented to person place and time  ASSESSMENT AND PLAN

## 2011-03-23 NOTE — Assessment & Plan Note (Signed)
Her rhythm is regular today. I will keep her on amiodarone. I have given her written instructions to check a TSH and liver profile. She otherwise remains on Pradaxa.

## 2011-03-24 ENCOUNTER — Other Ambulatory Visit: Payer: Self-pay | Admitting: Cardiovascular Disease

## 2011-03-27 ENCOUNTER — Other Ambulatory Visit: Payer: Self-pay | Admitting: Cardiovascular Disease

## 2011-04-21 ENCOUNTER — Other Ambulatory Visit: Payer: Self-pay | Admitting: Cardiology

## 2011-04-26 ENCOUNTER — Other Ambulatory Visit: Payer: Self-pay | Admitting: Cardiology

## 2011-06-22 ENCOUNTER — Other Ambulatory Visit: Payer: Self-pay | Admitting: Cardiology

## 2011-06-22 NOTE — Telephone Encounter (Signed)
..   Requested Prescriptions   Pending Prescriptions Disp Refills  . PACERONE 200 MG tablet [Pharmacy Med Name: PACERONE 200MG       TAB] 30 each 3    Sig: TAKE ONE TABLET BY MOUTH EVERY DAY

## 2011-08-26 ENCOUNTER — Other Ambulatory Visit: Payer: Self-pay | Admitting: Cardiology

## 2011-08-26 NOTE — Telephone Encounter (Signed)
..   Requested Prescriptions   Pending Prescriptions Disp Refills  . ramipril (ALTACE) 10 MG capsule [Pharmacy Med Name: RAMIPRIL 10MG        CAP] 30 capsule 6    Sig: TAKE ONE CAPSULE BY MOUTH EVERY DAY

## 2011-09-09 ENCOUNTER — Other Ambulatory Visit: Payer: Self-pay | Admitting: Cardiology

## 2011-09-21 ENCOUNTER — Telehealth: Payer: Self-pay

## 2011-09-21 ENCOUNTER — Ambulatory Visit: Payer: Managed Care, Other (non HMO) | Admitting: Cardiology

## 2011-09-21 NOTE — Telephone Encounter (Signed)
Called patient about her appointment, she had a migraine this mornint . need to call back to schedule appointment with one of the PAs

## 2011-09-27 ENCOUNTER — Encounter: Payer: Self-pay | Admitting: *Deleted

## 2011-09-29 ENCOUNTER — Ambulatory Visit: Payer: Managed Care, Other (non HMO) | Admitting: Physician Assistant

## 2011-10-05 ENCOUNTER — Ambulatory Visit: Payer: Managed Care, Other (non HMO) | Admitting: Physician Assistant

## 2011-10-08 ENCOUNTER — Other Ambulatory Visit: Payer: Self-pay | Admitting: Cardiovascular Disease

## 2011-10-11 ENCOUNTER — Ambulatory Visit: Payer: Managed Care, Other (non HMO) | Admitting: Physician Assistant

## 2011-10-18 ENCOUNTER — Ambulatory Visit: Payer: Managed Care, Other (non HMO) | Admitting: Physician Assistant

## 2011-10-24 ENCOUNTER — Ambulatory Visit: Payer: Managed Care, Other (non HMO) | Admitting: Physician Assistant

## 2011-11-07 ENCOUNTER — Ambulatory Visit: Payer: Managed Care, Other (non HMO) | Admitting: Physician Assistant

## 2011-11-09 ENCOUNTER — Ambulatory Visit: Payer: Managed Care, Other (non HMO) | Admitting: Cardiology

## 2011-12-03 IMAGING — CR DG CHEST 1V PORT
1 series · 1 of 1 positions shown · non-contrast
Comparison: 12/23/2009 earlier the same date.

CLINICAL DATA: Atrial fibrillation.  Endotracheal tube placement.

PORTABLE CHEST - 1 VIEW

[view not recorded]
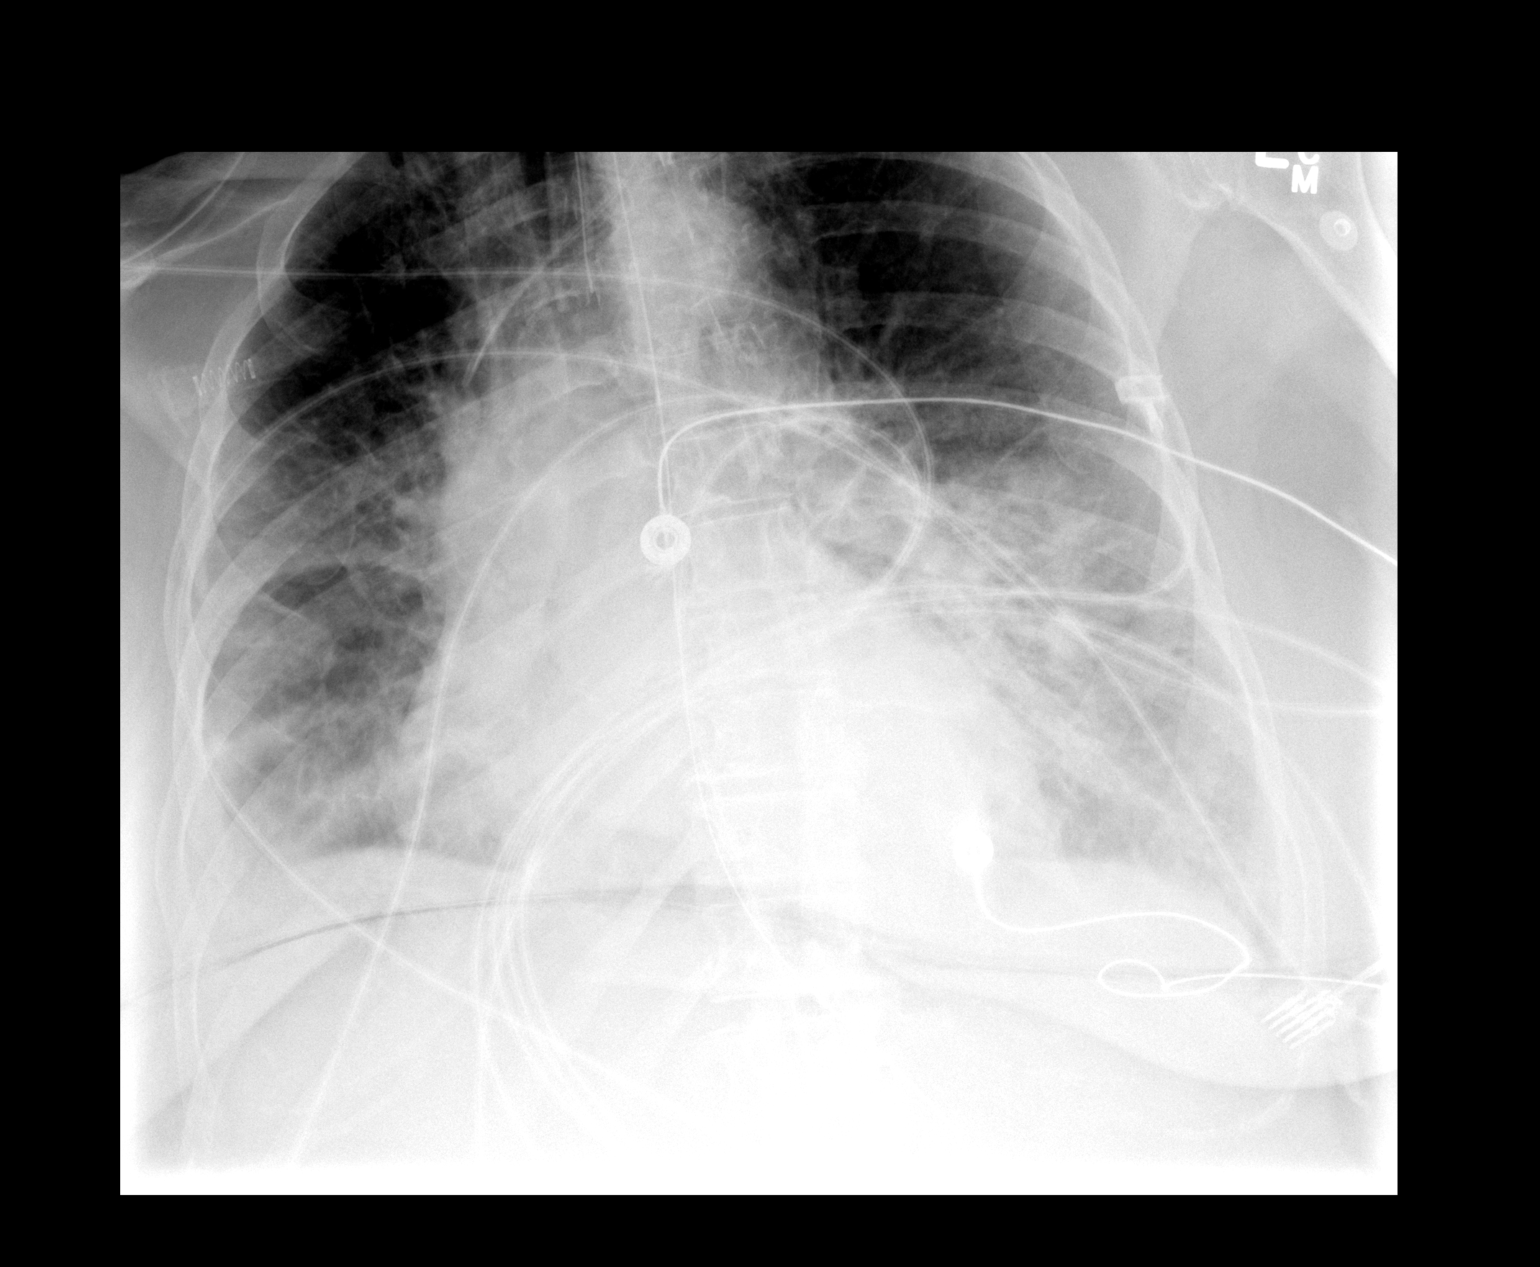

[1 of 1 positions shown; findings below may reference images not displayed]

FINDINGS: 2230 hours.  The endotracheal tube has been partially
withdrawn into the mid trachea, lying 4.3 cm proximal to the
carina.  A nasogastric tube has been placed, projecting below the
gastroesophageal junction.  Central line position is stable.  There
are stable bilateral air space opacities and small pleural
effusions.  The heart size and mediastinal contours are stable.
There is no pneumothorax.
IMPRESSION: Interval repositioning of endotracheal tube as described.
Nasogastric tube appears satisfactorily positioned.  There is
persistent edema and small pleural effusions.

## 2011-12-04 IMAGING — CR DG CHEST 1V PORT
1 series · 1 of 1 positions shown · non-contrast
Comparison: Portable chest x-ray of 12/23/2009

CLINICAL DATA: Respiratory distress, atrial fibrillation

PORTABLE CHEST - 1 VIEW

[view not recorded]
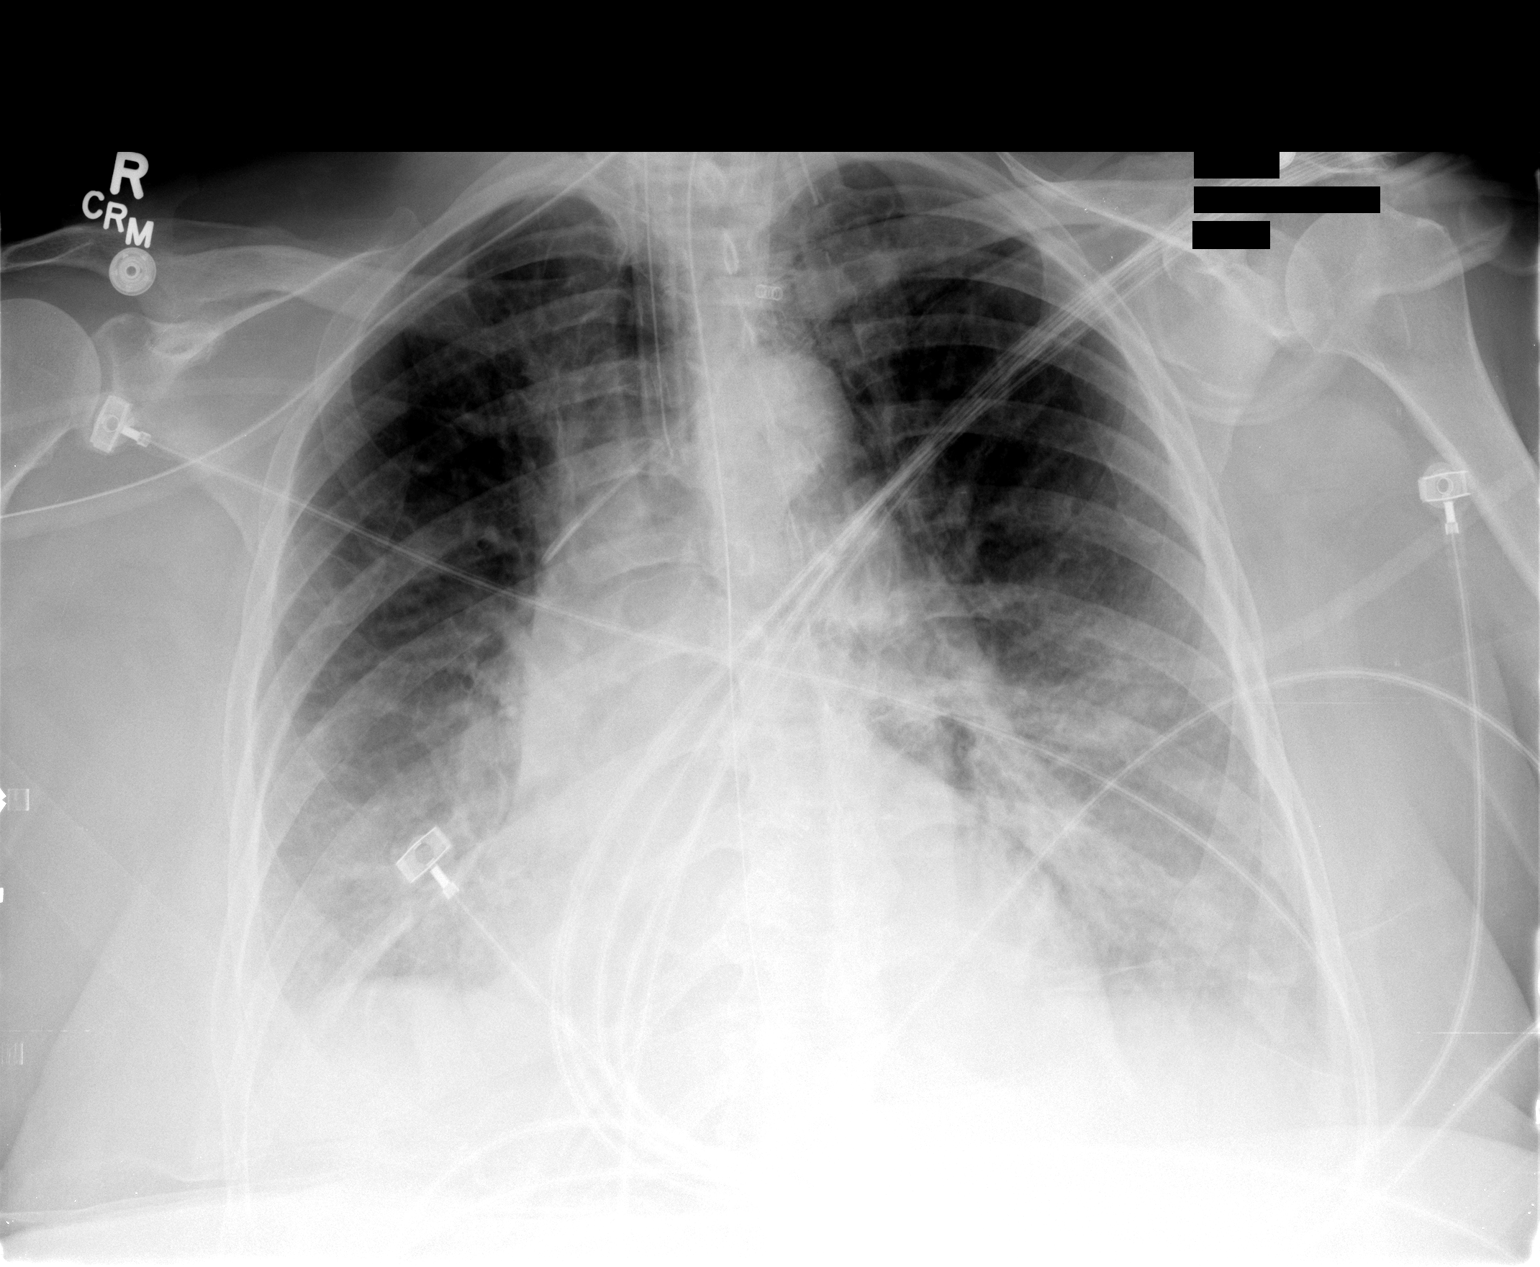

[1 of 1 positions shown; findings below may reference images not displayed]

FINDINGS: There has been slight improvement in airspace disease
most consistent with decreasing edema with probable effusions.
Cardiomegaly is stable.  Endotracheal tube and left central venous
line remain.
IMPRESSION: Some improvement in edema pattern.  Probable small effusions.

## 2011-12-06 IMAGING — CR DG CHEST 1V PORT
1 series · 1 of 1 positions shown · non-contrast
Comparison: Chest x-ray 12/24/2009

CLINICAL DATA: Shortness of breath.

PORTABLE CHEST - 1 VIEW

[view not recorded]
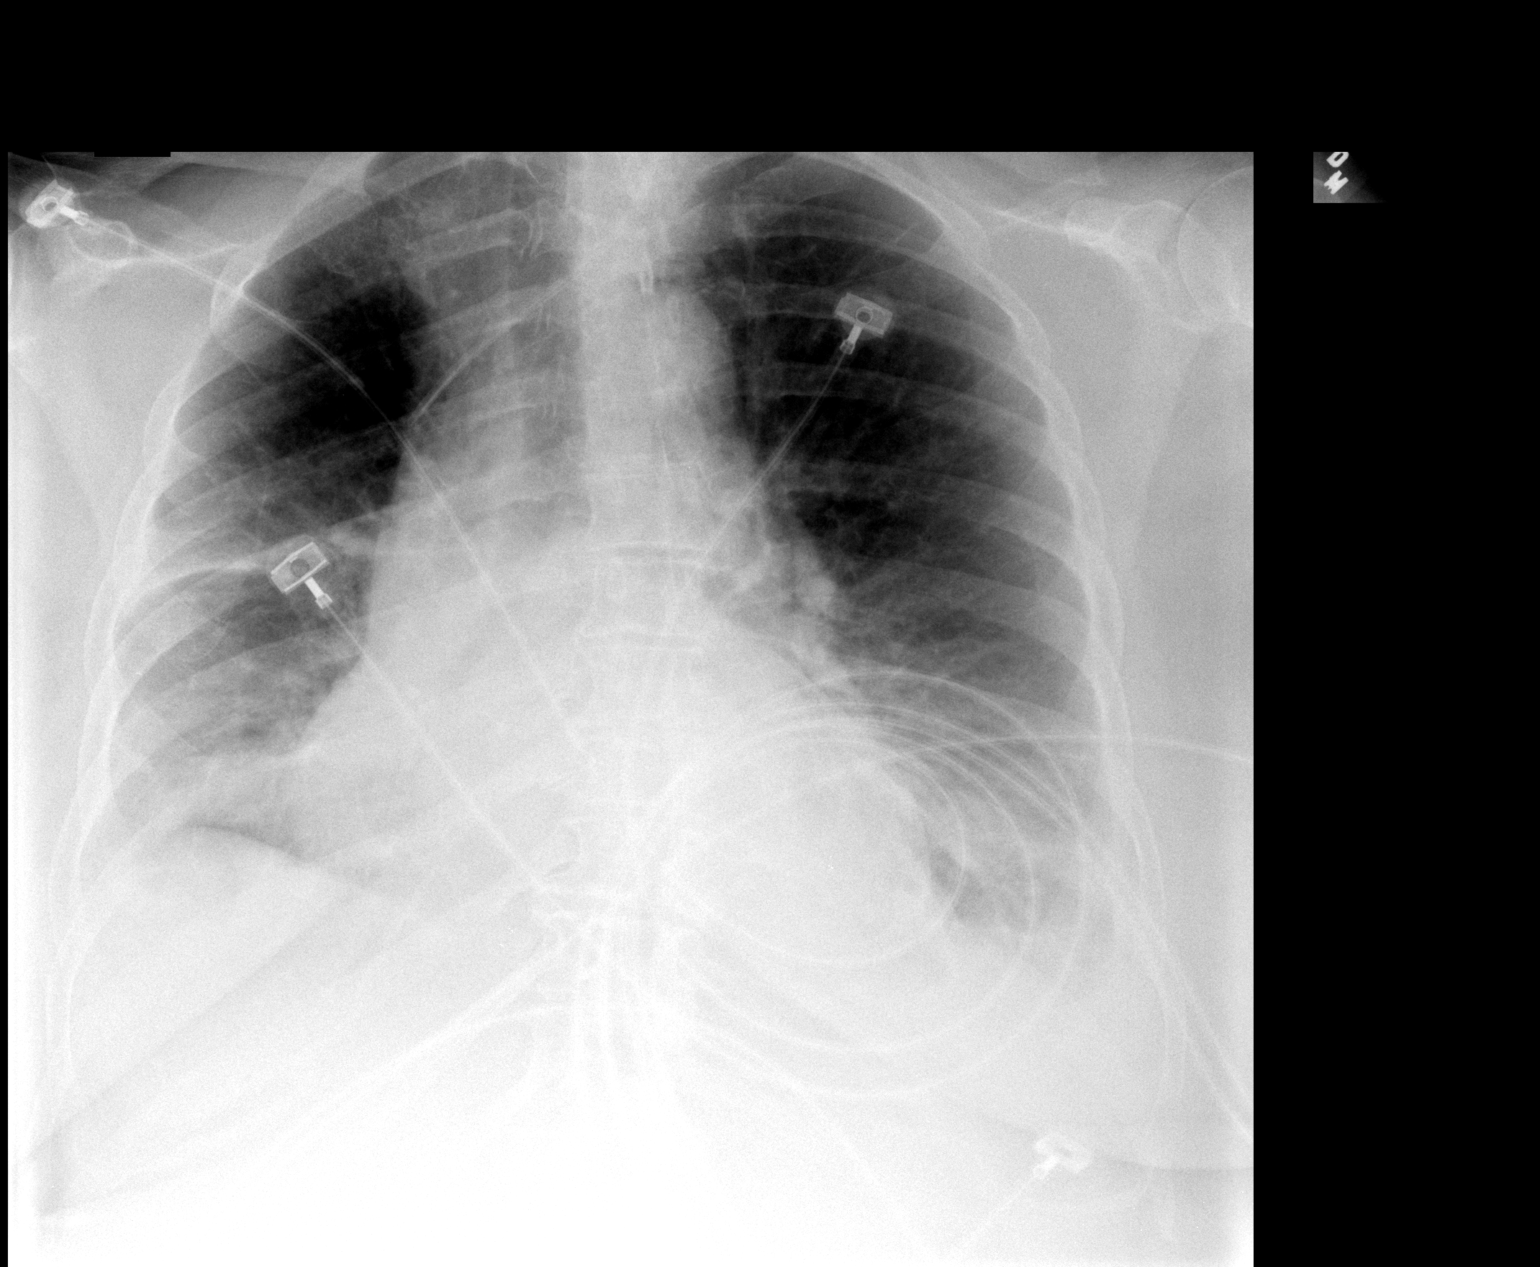

[1 of 1 positions shown; findings below may reference images not displayed]

FINDINGS: The endotracheal tube and NG tubes have been removed.
The left IJ catheter is stable.  The lungs show improved aeration
with resolving edema and atelectasis.  There are persistent pleural
effusions.
IMPRESSION: 1.  Interval removal of ET and NG tubes.
2.  Improved lung aeration with resolving edema and atelectasis.
Small pleural effusions persist.

## 2011-12-10 IMAGING — CR DG CHEST 2V
2 series · 2 of 2 positions shown · non-contrast
Comparison: 12/27/2009

CLINICAL DATA: Atrial fibrillation with rapid ventricular response.
Shortness of breath.

CHEST - 2 VIEW

[w chest pa]
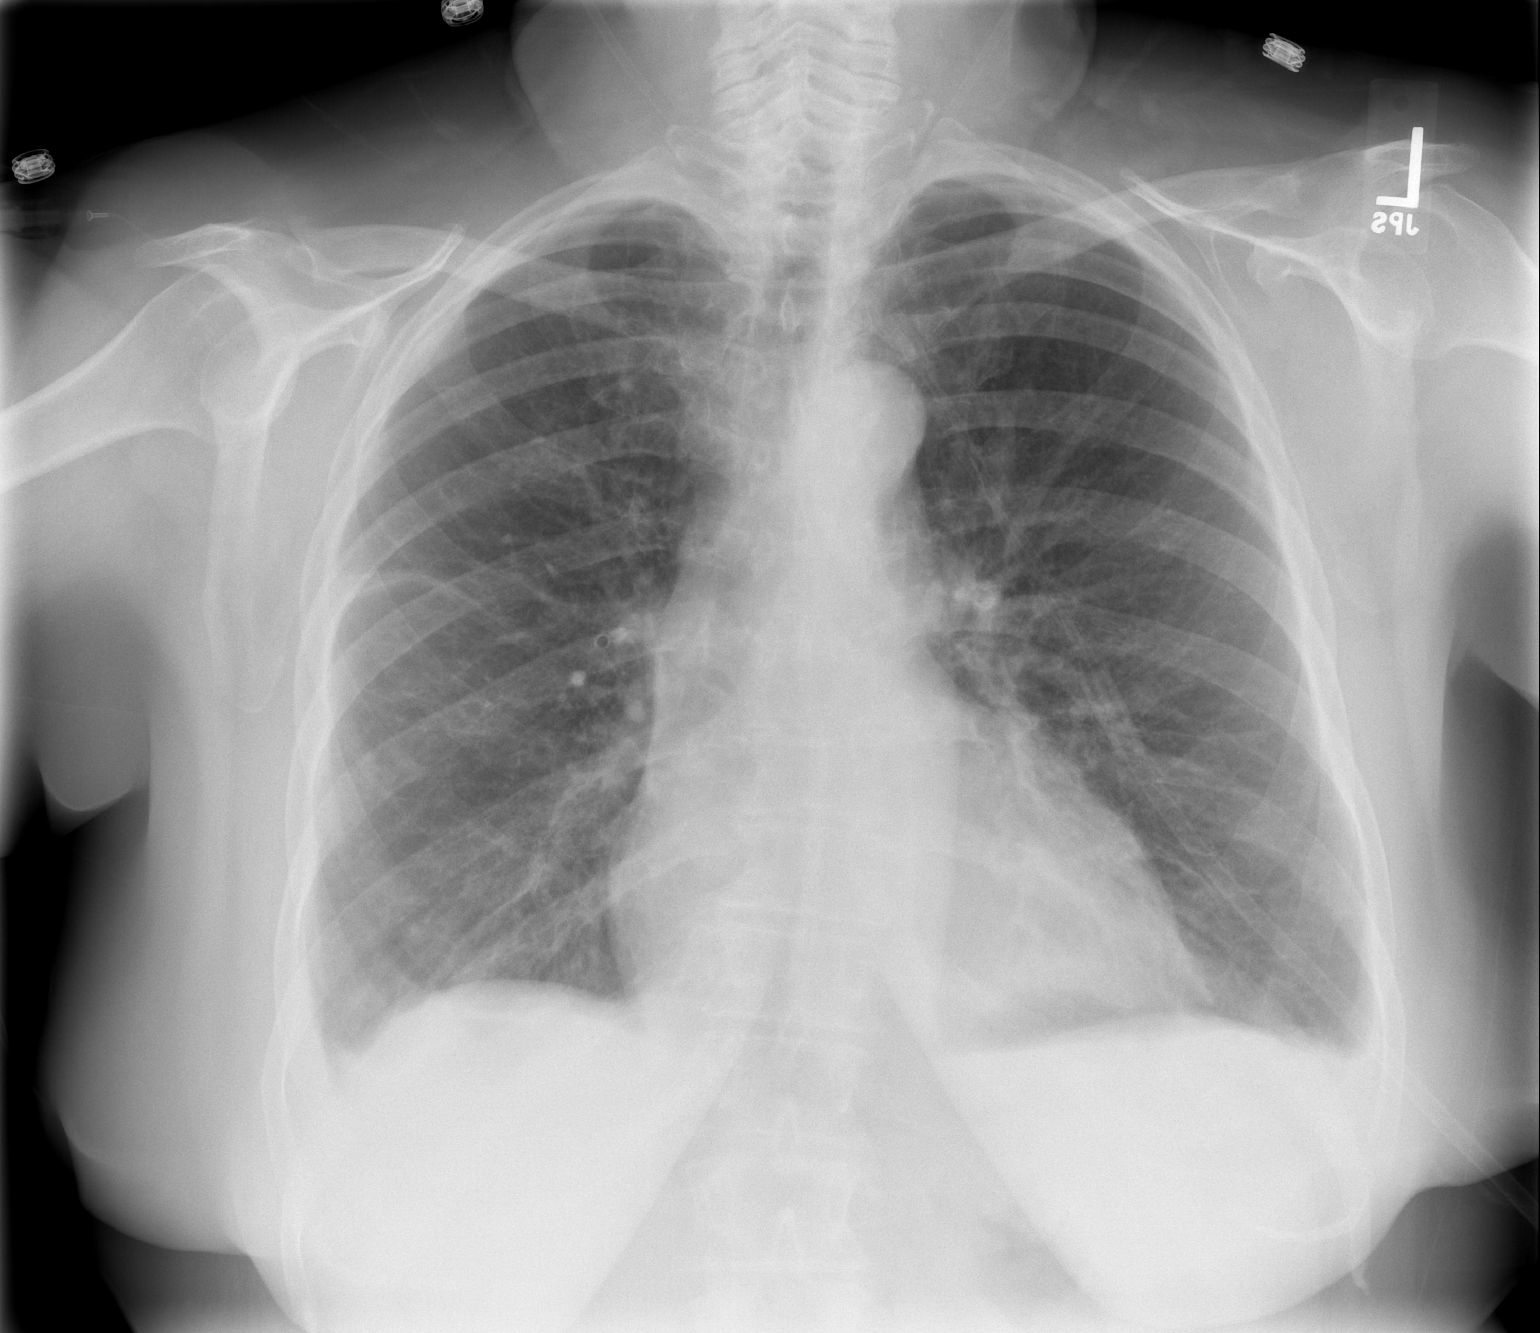

[w chest lat]
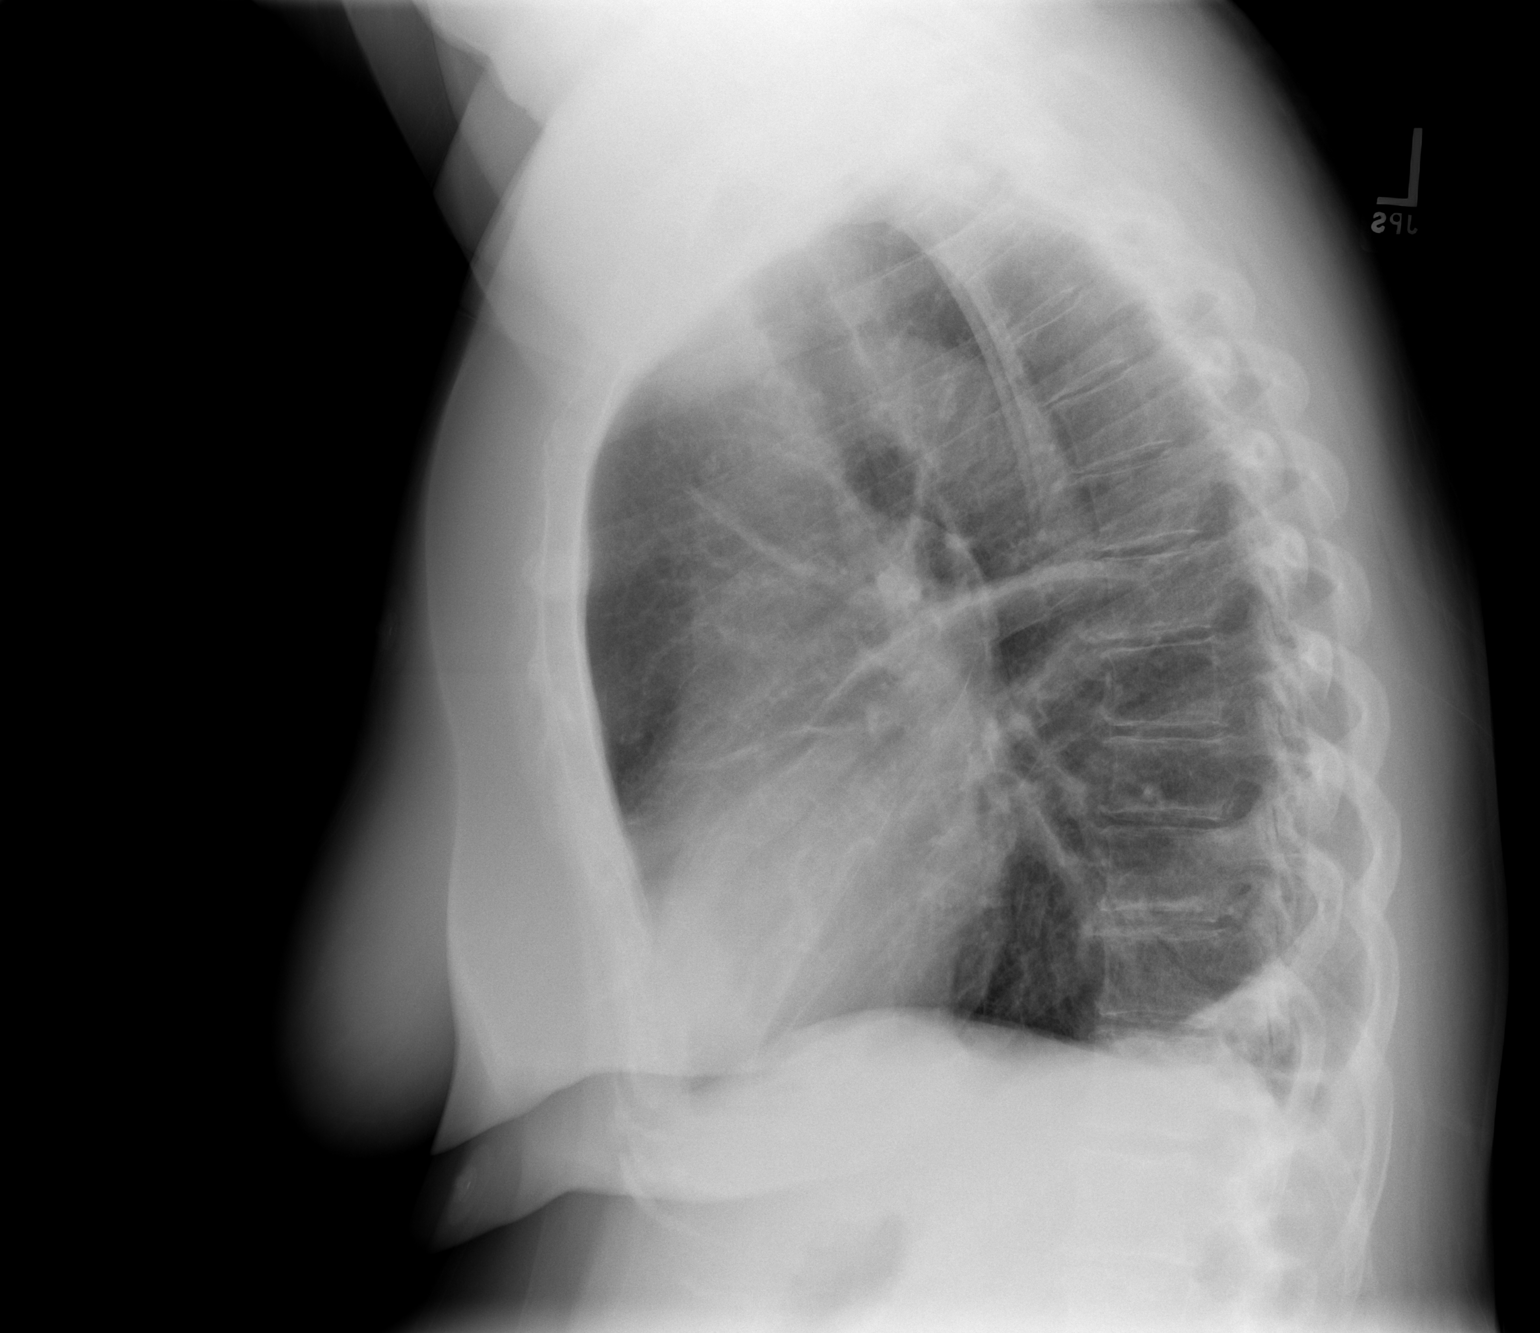

[2 of 2 positions shown; findings below may reference images not displayed]

FINDINGS: Aeration has significantly improved since the prior
examination with no residual edema.  There are small bilateral
pleural effusions.  Heart size is stable.
IMPRESSION: Resolved edema.  Small bilateral pleural effusions remain.

## 2011-12-18 ENCOUNTER — Other Ambulatory Visit: Payer: Self-pay | Admitting: Cardiology

## 2011-12-19 NOTE — Telephone Encounter (Signed)
..   Requested Prescriptions   Pending Prescriptions Disp Refills  . PACERONE 200 MG tablet [Pharmacy Med Name: PACERONE 200MG       TAB] 30 each 0    Sig: TAKE ONE TABLET BY MOUTH EVERY DAY  Patient need to keep appointment on 12/21/11 to get addition refills

## 2011-12-21 ENCOUNTER — Ambulatory Visit: Payer: Managed Care, Other (non HMO) | Admitting: Cardiology

## 2011-12-21 ENCOUNTER — Encounter: Payer: Self-pay | Admitting: Nurse Practitioner

## 2011-12-21 ENCOUNTER — Ambulatory Visit: Payer: Managed Care, Other (non HMO) | Admitting: Nurse Practitioner

## 2011-12-22 ENCOUNTER — Encounter: Payer: Self-pay | Admitting: Cardiology

## 2011-12-22 ENCOUNTER — Other Ambulatory Visit: Payer: Self-pay | Admitting: Cardiology

## 2011-12-22 NOTE — Telephone Encounter (Signed)
..   Requested Prescriptions   Pending Prescriptions Disp Refills  . pravastatin (PRAVACHOL) 40 MG tablet [Pharmacy Med Name: PRAVASTATIN 40MG     TAB] 30 tablet 0    Sig: TAKE ONE TABLET BY MOUTH AT BEDTIME

## 2011-12-23 ENCOUNTER — Telehealth: Payer: Self-pay | Admitting: Cardiology

## 2011-12-23 NOTE — Telephone Encounter (Signed)
Patient dismissed from [facility] by Rollene Rotunda, MD , effective 12/22/11. Dismissal letter sent out by certified / registered mail. rmf

## 2011-12-29 NOTE — Telephone Encounter (Signed)
Received signed return receipt from [employer] verifying delivery of dismissal letter. 12/28/11 rmf

## 2012-01-18 ENCOUNTER — Other Ambulatory Visit: Payer: Self-pay | Admitting: Cardiology

## 2012-01-18 ENCOUNTER — Telehealth: Payer: Self-pay | Admitting: Cardiology

## 2012-01-18 MED ORDER — PRAVASTATIN SODIUM 40 MG PO TABS: 40.0000 mg | ORAL_TABLET | Freq: Every day | ORAL | Status: DC

## 2012-01-18 MED ORDER — AMIODARONE HCL 200 MG PO TABS: 200.0000 mg | ORAL_TABLET | Freq: Every day | ORAL | Status: DC

## 2012-01-18 NOTE — Telephone Encounter (Signed)
New problem:  Patient husband calling stating it has not been 30 days. Can Dr. Walbridge Lions refill her medication this one last time until she finds another Development worker, international aid.

## 2012-02-17 ENCOUNTER — Other Ambulatory Visit: Payer: Self-pay | Admitting: Cardiology

## 2012-02-20 ENCOUNTER — Other Ambulatory Visit: Payer: Self-pay | Admitting: Cardiology

## 2012-02-20 NOTE — Progress Notes (Signed)
Patient ID: Cathy Robles, female   DOB: [birth year], 71 y.o.   MRN: [id number] Benjamin Stain in [place], Phone [phone], to let them know that we are no longer seeing this patient and we could not refill any medications for her.  Okey Dupre

## 2012-02-23 ENCOUNTER — Telehealth: Payer: Self-pay | Admitting: Cardiology

## 2012-02-23 NOTE — Telephone Encounter (Signed)
Pt was discharged from this practice and her letter was received according to documentation 12/28/2011.  Pt has not been seen in over 1 yr due to her frequent no show for appointments and cancelling the same day.  Husband is requested more refills on her medication as she has not found another cardiologist yet.  This would be against office policy and will need to be approved by management.  Will forward to Doylene Bode, RN

## 2012-02-23 NOTE — Telephone Encounter (Signed)
Pam Here is the note that I called you about. Love you Okey Dupre

## 2012-02-23 NOTE — Telephone Encounter (Signed)
Pt's husband calling to get refill of pacerone 200 mg at [facility] in [place], I explained she was discharged in august so I didn't know if we can refill since we are no longer her dr, he was asking because she hasn't gotten another doc yet, pls call

## 2012-03-01 ENCOUNTER — Telehealth: Payer: Self-pay

## 2012-03-08 ENCOUNTER — Other Ambulatory Visit: Payer: Self-pay | Admitting: Cardiology

## 2012-04-12 ENCOUNTER — Other Ambulatory Visit: Payer: Self-pay | Admitting: Cardiovascular Disease

## 2012-04-19 ENCOUNTER — Other Ambulatory Visit: Payer: Self-pay | Admitting: Cardiology

## 2012-04-19 ENCOUNTER — Other Ambulatory Visit: Payer: Self-pay | Admitting: Cardiovascular Disease

## 2012-04-19 NOTE — Telephone Encounter (Signed)
Spoke with Cathy Robles in reference to some community resources for her and her husband. Explained again that we could not see her and talked with her about her dismissal letter that she had received.

## 2012-05-13 ENCOUNTER — Other Ambulatory Visit: Payer: Self-pay | Admitting: Cardiology

## 2012-10-10 ENCOUNTER — Other Ambulatory Visit: Payer: Self-pay | Admitting: Nurse Practitioner

## 2012-11-05 ENCOUNTER — Telehealth: Payer: Self-pay | Admitting: Nurse Practitioner

## 2012-11-05 NOTE — Telephone Encounter (Signed)
Back pain x 1 month.  No particular injury.  Has degenerative disc disease.  Would like to see Bennie Pierini, FNP.  Appt scheduled for 11/08/12.  Patient aware.

## 2012-11-08 ENCOUNTER — Ambulatory Visit: Payer: Self-pay | Admitting: Nurse Practitioner

## 2012-12-25 ENCOUNTER — Other Ambulatory Visit: Payer: Self-pay | Admitting: Family Medicine

## 2012-12-25 DIAGNOSIS — M549 Dorsalgia, unspecified: Secondary | ICD-10-CM

## 2012-12-25 MED ORDER — AMITRIPTYLINE HCL 25 MG PO TABS: 25.0000 mg | ORAL_TABLET | Freq: Every day | ORAL | Status: DC

## 2013-09-08 ENCOUNTER — Encounter (HOSPITAL_COMMUNITY): Payer: Self-pay | Admitting: *Deleted

## 2013-09-08 ENCOUNTER — Inpatient Hospital Stay (HOSPITAL_COMMUNITY): Payer: Managed Care, Other (non HMO)

## 2013-09-08 ENCOUNTER — Inpatient Hospital Stay (HOSPITAL_COMMUNITY)
Admission: AD | Admit: 2013-09-08 | Discharge: 2013-09-23 | DRG: 870 | Disposition: E | Payer: Managed Care, Other (non HMO) | Source: Other Acute Inpatient Hospital | Attending: Internal Medicine | Admitting: Internal Medicine

## 2013-09-08 DIAGNOSIS — E8809 Other disorders of plasma-protein metabolism, not elsewhere classified: Secondary | ICD-10-CM | POA: Diagnosis present

## 2013-09-08 DIAGNOSIS — F17200 Nicotine dependence, unspecified, uncomplicated: Secondary | ICD-10-CM | POA: Diagnosis present

## 2013-09-08 DIAGNOSIS — Z888 Allergy status to other drugs, medicaments and biological substances status: Secondary | ICD-10-CM

## 2013-09-08 DIAGNOSIS — B369 Superficial mycosis, unspecified: Secondary | ICD-10-CM | POA: Diagnosis present

## 2013-09-08 DIAGNOSIS — R6521 Severe sepsis with septic shock: Secondary | ICD-10-CM

## 2013-09-08 DIAGNOSIS — I480 Paroxysmal atrial fibrillation: Secondary | ICD-10-CM

## 2013-09-08 DIAGNOSIS — K117 Disturbances of salivary secretion: Secondary | ICD-10-CM

## 2013-09-08 DIAGNOSIS — A0472 Enterocolitis due to Clostridium difficile, not specified as recurrent: Secondary | ICD-10-CM | POA: Diagnosis present

## 2013-09-08 DIAGNOSIS — E039 Hypothyroidism, unspecified: Secondary | ICD-10-CM | POA: Diagnosis present

## 2013-09-08 DIAGNOSIS — J4489 Other specified chronic obstructive pulmonary disease: Secondary | ICD-10-CM | POA: Diagnosis present

## 2013-09-08 DIAGNOSIS — R6520 Severe sepsis without septic shock: Secondary | ICD-10-CM

## 2013-09-08 DIAGNOSIS — G894 Chronic pain syndrome: Secondary | ICD-10-CM | POA: Diagnosis present

## 2013-09-08 DIAGNOSIS — K7200 Acute and subacute hepatic failure without coma: Secondary | ICD-10-CM | POA: Diagnosis present

## 2013-09-08 DIAGNOSIS — Z96659 Presence of unspecified artificial knee joint: Secondary | ICD-10-CM

## 2013-09-08 DIAGNOSIS — Z515 Encounter for palliative care: Secondary | ICD-10-CM

## 2013-09-08 DIAGNOSIS — J90 Pleural effusion, not elsewhere classified: Secondary | ICD-10-CM | POA: Diagnosis present

## 2013-09-08 DIAGNOSIS — K219 Gastro-esophageal reflux disease without esophagitis: Secondary | ICD-10-CM | POA: Diagnosis present

## 2013-09-08 DIAGNOSIS — IMO0001 Reserved for inherently not codable concepts without codable children: Secondary | ICD-10-CM | POA: Diagnosis present

## 2013-09-08 DIAGNOSIS — R7401 Elevation of levels of liver transaminase levels: Secondary | ICD-10-CM | POA: Diagnosis present

## 2013-09-08 DIAGNOSIS — I509 Heart failure, unspecified: Secondary | ICD-10-CM | POA: Diagnosis present

## 2013-09-08 DIAGNOSIS — I498 Other specified cardiac arrhythmias: Secondary | ICD-10-CM | POA: Diagnosis present

## 2013-09-08 DIAGNOSIS — I428 Other cardiomyopathies: Secondary | ICD-10-CM | POA: Diagnosis present

## 2013-09-08 DIAGNOSIS — I214 Non-ST elevation (NSTEMI) myocardial infarction: Secondary | ICD-10-CM | POA: Diagnosis present

## 2013-09-08 DIAGNOSIS — Z9119 Patient's noncompliance with other medical treatment and regimen: Secondary | ICD-10-CM

## 2013-09-08 DIAGNOSIS — F411 Generalized anxiety disorder: Secondary | ICD-10-CM | POA: Diagnosis present

## 2013-09-08 DIAGNOSIS — A4902 Methicillin resistant Staphylococcus aureus infection, unspecified site: Secondary | ICD-10-CM | POA: Diagnosis present

## 2013-09-08 DIAGNOSIS — R7402 Elevation of levels of lactic acid dehydrogenase (LDH): Secondary | ICD-10-CM | POA: Diagnosis present

## 2013-09-08 DIAGNOSIS — I10 Essential (primary) hypertension: Secondary | ICD-10-CM | POA: Diagnosis present

## 2013-09-08 DIAGNOSIS — A419 Sepsis, unspecified organism: Secondary | ICD-10-CM | POA: Diagnosis present

## 2013-09-08 DIAGNOSIS — I4891 Unspecified atrial fibrillation: Secondary | ICD-10-CM | POA: Diagnosis present

## 2013-09-08 DIAGNOSIS — K449 Diaphragmatic hernia without obstruction or gangrene: Secondary | ICD-10-CM | POA: Diagnosis present

## 2013-09-08 DIAGNOSIS — Z880 Allergy status to penicillin: Secondary | ICD-10-CM

## 2013-09-08 DIAGNOSIS — J9620 Acute and chronic respiratory failure, unspecified whether with hypoxia or hypercapnia: Secondary | ICD-10-CM | POA: Diagnosis present

## 2013-09-08 DIAGNOSIS — I447 Left bundle-branch block, unspecified: Secondary | ICD-10-CM | POA: Diagnosis present

## 2013-09-08 DIAGNOSIS — I5023 Acute on chronic systolic (congestive) heart failure: Secondary | ICD-10-CM | POA: Diagnosis present

## 2013-09-08 DIAGNOSIS — J9600 Acute respiratory failure, unspecified whether with hypoxia or hypercapnia: Secondary | ICD-10-CM | POA: Diagnosis present

## 2013-09-08 DIAGNOSIS — I2510 Atherosclerotic heart disease of native coronary artery without angina pectoris: Secondary | ICD-10-CM | POA: Diagnosis present

## 2013-09-08 DIAGNOSIS — M1990 Unspecified osteoarthritis, unspecified site: Secondary | ICD-10-CM | POA: Diagnosis present

## 2013-09-08 DIAGNOSIS — J869 Pyothorax without fistula: Secondary | ICD-10-CM | POA: Diagnosis present

## 2013-09-08 DIAGNOSIS — Z886 Allergy status to analgesic agent status: Secondary | ICD-10-CM

## 2013-09-08 DIAGNOSIS — R791 Abnormal coagulation profile: Secondary | ICD-10-CM | POA: Diagnosis present

## 2013-09-08 DIAGNOSIS — R0600 Dyspnea, unspecified: Secondary | ICD-10-CM

## 2013-09-08 DIAGNOSIS — J189 Pneumonia, unspecified organism: Secondary | ICD-10-CM

## 2013-09-08 DIAGNOSIS — Z6841 Body Mass Index (BMI) 40.0 and over, adult: Secondary | ICD-10-CM

## 2013-09-08 DIAGNOSIS — R188 Other ascites: Secondary | ICD-10-CM | POA: Diagnosis present

## 2013-09-08 DIAGNOSIS — Z66 Do not resuscitate: Secondary | ICD-10-CM | POA: Diagnosis not present

## 2013-09-08 DIAGNOSIS — J449 Chronic obstructive pulmonary disease, unspecified: Secondary | ICD-10-CM | POA: Diagnosis present

## 2013-09-08 DIAGNOSIS — E785 Hyperlipidemia, unspecified: Secondary | ICD-10-CM | POA: Diagnosis present

## 2013-09-08 DIAGNOSIS — D696 Thrombocytopenia, unspecified: Secondary | ICD-10-CM | POA: Diagnosis not present

## 2013-09-08 DIAGNOSIS — Z91199 Patient's noncompliance with other medical treatment and regimen due to unspecified reason: Secondary | ICD-10-CM

## 2013-09-08 DIAGNOSIS — J13 Pneumonia due to Streptococcus pneumoniae: Secondary | ICD-10-CM | POA: Diagnosis present

## 2013-09-08 DIAGNOSIS — Z885 Allergy status to narcotic agent status: Secondary | ICD-10-CM

## 2013-09-08 DIAGNOSIS — E876 Hypokalemia: Secondary | ICD-10-CM | POA: Diagnosis not present

## 2013-09-08 DIAGNOSIS — R740 Nonspecific elevation of levels of transaminase and lactic acid dehydrogenase [LDH]: Secondary | ICD-10-CM

## 2013-09-08 DIAGNOSIS — R531 Weakness: Secondary | ICD-10-CM

## 2013-09-08 DIAGNOSIS — IMO0002 Reserved for concepts with insufficient information to code with codable children: Secondary | ICD-10-CM | POA: Diagnosis not present

## 2013-09-08 HISTORY — DX: Acute on chronic systolic (congestive) heart failure: I50.23

## 2013-09-08 HISTORY — DX: Patient's noncompliance with other medical treatment and regimen: Z91.19

## 2013-09-08 HISTORY — DX: Generalized anxiety disorder: F41.1

## 2013-09-08 HISTORY — DX: Acute respiratory failure, unspecified whether with hypoxia or hypercapnia: J96.00

## 2013-09-08 HISTORY — DX: Gastro-esophageal reflux disease without esophagitis: K21.9

## 2013-09-08 HISTORY — DX: Hyperlipidemia, unspecified: E78.5

## 2013-09-08 HISTORY — DX: Pneumonia due to Streptococcus pneumoniae: J13

## 2013-09-08 HISTORY — DX: Acute and chronic respiratory failure, unspecified whether with hypoxia or hypercapnia: J96.20

## 2013-09-08 HISTORY — DX: Pleural effusion, not elsewhere classified: J90

## 2013-09-08 HISTORY — DX: Dorsalgia, unspecified: M54.9

## 2013-09-08 HISTORY — DX: Morbid (severe) obesity due to excess calories: E66.01

## 2013-09-08 HISTORY — DX: Paroxysmal atrial fibrillation: I48.0

## 2013-09-08 HISTORY — DX: Other cardiomyopathies: I42.8

## 2013-09-08 HISTORY — DX: Left bundle-branch block, unspecified: I44.7

## 2013-09-08 HISTORY — DX: Sepsis, unspecified organism: A41.9

## 2013-09-08 HISTORY — DX: Nicotine dependence, unspecified, uncomplicated: F17.200

## 2013-09-08 HISTORY — DX: Severe sepsis with septic shock: R65.21

## 2013-09-08 HISTORY — DX: Non-ST elevation (NSTEMI) myocardial infarction: I21.4

## 2013-09-08 LAB — BASIC METABOLIC PANEL
BUN: 49 mg/dL — AB (ref 6–23)
BUN: 50 mg/dL — ABNORMAL HIGH (ref 6–23)
CALCIUM: 7.5 mg/dL — AB (ref 8.4–10.5)
CHLORIDE: 99 meq/L (ref 96–112)
CO2: 17 mEq/L — ABNORMAL LOW (ref 19–32)
CO2: 19 meq/L (ref 19–32)
Calcium: 7.7 mg/dL — ABNORMAL LOW (ref 8.4–10.5)
Chloride: 100 mEq/L (ref 96–112)
Creatinine, Ser: 1.04 mg/dL (ref 0.50–1.10)
Creatinine, Ser: 1.07 mg/dL (ref 0.50–1.10)
GFR calc Af Amer: 62 mL/min — ABNORMAL LOW (ref >90)
GFR calc Af Amer: 60 mL/min — ABNORMAL LOW (ref >90)
GFR calc non Af Amer: 53 mL/min — ABNORMAL LOW (ref >90)
GFR calc non Af Amer: 51 mL/min — ABNORMAL LOW (ref >90)
GLUCOSE: 166 mg/dL — AB (ref 70–99)
Glucose, Bld: 120 mg/dL — ABNORMAL HIGH (ref 70–99)
Potassium: 4.7 mEq/L (ref 3.7–5.3)
Potassium: 4.1 mEq/L (ref 3.7–5.3)
SODIUM: 134 meq/L — AB (ref 137–147)
Sodium: 132 mEq/L — ABNORMAL LOW (ref 137–147)

## 2013-09-08 LAB — BLOOD GAS, ARTERIAL
Acid-base deficit: 9.4 mmol/L — ABNORMAL HIGH (ref 0.0–2.0)
BICARBONATE: 17.4 meq/L — AB (ref 20.0–24.0)
Drawn by: 24513
FIO2: 100 %
MECHVT: 400 mL
O2 Saturation: 96.1 %
PEEP: 5 cmH2O
Patient temperature: 98.6
RATE: 18 resp/min
TCO2: 18.8 mmol/L (ref 0–100)
pCO2 arterial: 47.1 mmHg — ABNORMAL HIGH (ref 35.0–45.0)
pH, Arterial: 7.191 — CL (ref 7.350–7.450)
pO2, Arterial: 125 mmHg — ABNORMAL HIGH (ref 80.0–100.0)

## 2013-09-08 LAB — POCT I-STAT 3, ART BLOOD GAS (G3+)
Acid-base deficit: 5 mmol/L — ABNORMAL HIGH (ref 0.0–2.0)
BICARBONATE: 20.7 meq/L (ref 20.0–24.0)
O2 Saturation: 99 %
PH ART: 7.352 (ref 7.350–7.450)
Patient temperature: 96.9
TCO2: 22 mmol/L (ref 0–100)
pCO2 arterial: 36.9 mmHg (ref 35.0–45.0)
pO2, Arterial: 144 mmHg — ABNORMAL HIGH (ref 80.0–100.0)

## 2013-09-08 LAB — CBC
HCT: 38 % (ref 36.0–46.0)
HEMOGLOBIN: 11.8 g/dL — AB (ref 12.0–15.0)
MCH: 28.6 pg (ref 26.0–34.0)
MCHC: 31.1 g/dL (ref 30.0–36.0)
MCV: 92.2 fL (ref 78.0–100.0)
Platelets: 128 10*3/uL — ABNORMAL LOW (ref 150–400)
RBC: 4.12 MIL/uL (ref 3.87–5.11)
RDW: 17.2 % — ABNORMAL HIGH (ref 11.5–15.5)
WBC: 21.6 10*3/uL — ABNORMAL HIGH (ref 4.0–10.5)

## 2013-09-08 LAB — GLUCOSE, CAPILLARY
GLUCOSE-CAPILLARY: 118 mg/dL — AB (ref 70–99)
Glucose-Capillary: 173 mg/dL — ABNORMAL HIGH (ref 70–99)
Glucose-Capillary: 136 mg/dL — ABNORMAL HIGH (ref 70–99)
Glucose-Capillary: 94 mg/dL (ref 70–99)
Glucose-Capillary: 91 mg/dL (ref 70–99)

## 2013-09-08 LAB — HEMOGLOBIN A1C
Hgb A1c MFr Bld: 5.9 % — ABNORMAL HIGH (ref <5.7)
MEAN PLASMA GLUCOSE: 123 mg/dL — AB (ref <117)

## 2013-09-08 LAB — URINALYSIS, ROUTINE W REFLEX MICROSCOPIC
Glucose, UA: NEGATIVE mg/dL
KETONES UR: NEGATIVE mg/dL
Leukocytes, UA: NEGATIVE
NITRITE: NEGATIVE
PH: 5.5 (ref 5.0–8.0)
Protein, ur: 30 mg/dL — AB
SPECIFIC GRAVITY, URINE: 1.02 (ref 1.005–1.030)
Urobilinogen, UA: 2 mg/dL — ABNORMAL HIGH (ref 0.0–1.0)

## 2013-09-08 LAB — CBC WITH DIFFERENTIAL/PLATELET
BASOS PCT: 0 % (ref 0–1)
Basophils Absolute: 0 10*3/uL (ref 0.0–0.1)
EOS ABS: 0 10*3/uL (ref 0.0–0.7)
Eosinophils Relative: 0 % (ref 0–5)
HCT: 36.6 % (ref 36.0–46.0)
HEMOGLOBIN: 11.5 g/dL — AB (ref 12.0–15.0)
LYMPHS ABS: 1.1 10*3/uL (ref 0.7–4.0)
Lymphocytes Relative: 5 % — ABNORMAL LOW (ref 12–46)
MCH: 29.2 pg (ref 26.0–34.0)
MCHC: 31.4 g/dL (ref 30.0–36.0)
MCV: 92.9 fL (ref 78.0–100.0)
MONO ABS: 1.1 10*3/uL — AB (ref 0.1–1.0)
Monocytes Relative: 5 % (ref 3–12)
Neutro Abs: 20.4 10*3/uL — ABNORMAL HIGH (ref 1.7–7.7)
Neutrophils Relative %: 90 % — ABNORMAL HIGH (ref 43–77)
Platelets: 144 10*3/uL — ABNORMAL LOW (ref 150–400)
RBC: 3.94 MIL/uL (ref 3.87–5.11)
RDW: 17.1 % — ABNORMAL HIGH (ref 11.5–15.5)
WBC: 22.6 10*3/uL — ABNORMAL HIGH (ref 4.0–10.5)

## 2013-09-08 LAB — URINE MICROSCOPIC-ADD ON

## 2013-09-08 LAB — COMPREHENSIVE METABOLIC PANEL
ALBUMIN: 2.1 g/dL — AB (ref 3.5–5.2)
ALT: 495 U/L — AB (ref 0–35)
AST: 678 U/L — AB (ref 0–37)
Alkaline Phosphatase: 89 U/L (ref 39–117)
BUN: 44 mg/dL — AB (ref 6–23)
CHLORIDE: 100 meq/L (ref 96–112)
CO2: 15 meq/L — AB (ref 19–32)
CREATININE: 0.94 mg/dL (ref 0.50–1.10)
Calcium: 7 mg/dL — ABNORMAL LOW (ref 8.4–10.5)
GFR calc Af Amer: 70 mL/min — ABNORMAL LOW (ref >90)
GFR calc non Af Amer: 60 mL/min — ABNORMAL LOW (ref >90)
GLUCOSE: 177 mg/dL — AB (ref 70–99)
POTASSIUM: 4.4 meq/L (ref 3.7–5.3)
Sodium: 132 mEq/L — ABNORMAL LOW (ref 137–147)
TOTAL PROTEIN: 5.1 g/dL — AB (ref 6.0–8.3)
Total Bilirubin: 1.2 mg/dL (ref 0.3–1.2)

## 2013-09-08 LAB — LACTIC ACID, PLASMA: Lactic Acid, Venous: 2.5 mmol/L — ABNORMAL HIGH (ref 0.5–2.2)

## 2013-09-08 LAB — TSH: TSH: 23.59 u[IU]/mL — AB (ref 0.350–4.500)

## 2013-09-08 LAB — CARBOXYHEMOGLOBIN
CARBOXYHEMOGLOBIN: 0.6 % (ref 0.5–1.5)
Methemoglobin: 1.3 % (ref 0.0–1.5)
O2 Saturation: 70.5 %
Total hemoglobin: 11.9 g/dL — ABNORMAL LOW (ref 12.0–16.0)

## 2013-09-08 LAB — PROCALCITONIN
PROCALCITONIN: 0.78 ng/mL
Procalcitonin: 1.34 ng/mL

## 2013-09-08 LAB — PRO B NATRIURETIC PEPTIDE: Pro B Natriuretic peptide (BNP): 10714 pg/mL — ABNORMAL HIGH (ref 0–125)

## 2013-09-08 LAB — TROPONIN I
TROPONIN I: 0.44 ng/mL — AB (ref <0.30)
Troponin I: <0.3 ng/mL (ref <0.30)
Troponin I: 0.7 ng/mL (ref <0.30)

## 2013-09-08 LAB — HEPARIN LEVEL (UNFRACTIONATED): Heparin Unfractionated: 0.2 IU/mL — ABNORMAL LOW (ref 0.30–0.70)

## 2013-09-08 LAB — MRSA PCR SCREENING: MRSA BY PCR: POSITIVE — AB

## 2013-09-08 LAB — STREP PNEUMONIAE URINARY ANTIGEN: STREP PNEUMO URINARY ANTIGEN: POSITIVE — AB

## 2013-09-08 MED ORDER — INSULIN ASPART 100 UNIT/ML ~~LOC~~ SOLN: 0.0000 [IU] | Freq: Every day | SUBCUTANEOUS | Status: DC

## 2013-09-08 MED ORDER — PNEUMOCOCCAL VAC POLYVALENT 25 MCG/0.5ML IJ INJ
0.5000 mL | INJECTION | INTRAMUSCULAR | Status: DC
  Filled 2013-09-08: qty 0.5

## 2013-09-08 MED ORDER — HEPARIN BOLUS VIA INFUSION
2000.0000 [IU] | Freq: Once | INTRAVENOUS | Status: AC
  Administered 2013-09-08: 2000 [IU] via INTRAVENOUS
  Filled 2013-09-08: qty 2000

## 2013-09-08 MED ORDER — INSULIN ASPART 100 UNIT/ML ~~LOC~~ SOLN
4.0000 [IU] | Freq: Three times a day (TID) | SUBCUTANEOUS | Status: DC
Start: 2013-09-08 — End: 2013-09-08

## 2013-09-08 MED ORDER — DEXTROSE 5 % IV SOLN
2.0000 g | Freq: Once | INTRAVENOUS | Status: AC
  Administered 2013-09-08: 2 g via INTRAVENOUS
  Filled 2013-09-08: qty 2

## 2013-09-08 MED ORDER — INSULIN ASPART 100 UNIT/ML ~~LOC~~ SOLN
2.0000 [IU] | SUBCUTANEOUS | Status: DC
  Administered 2013-09-08 – 2013-09-12 (×10): 2 [IU] via SUBCUTANEOUS

## 2013-09-08 MED ORDER — FENTANYL CITRATE 0.05 MG/ML IJ SOLN: 50.0000 ug | INTRAMUSCULAR | Status: DC | PRN

## 2013-09-08 MED ORDER — LEVOTHYROXINE SODIUM 150 MCG PO TABS
150.0000 ug | ORAL_TABLET | Freq: Every day | ORAL | Status: DC
  Administered 2013-09-08 – 2013-09-12 (×5): 150 ug via ORAL
  Filled 2013-09-08 (×6): qty 1

## 2013-09-08 MED ORDER — PANTOPRAZOLE SODIUM 40 MG IV SOLR
40.0000 mg | Freq: Every day | INTRAVENOUS | Status: DC
  Administered 2013-09-08: 40 mg via INTRAVENOUS
  Filled 2013-09-08 (×2): qty 40

## 2013-09-08 MED ORDER — AMIODARONE HCL IN DEXTROSE 360-4.14 MG/200ML-% IV SOLN
60.0000 mg/h | INTRAVENOUS | Status: AC
  Filled 2013-09-08: qty 200

## 2013-09-08 MED ORDER — INSULIN ASPART 100 UNIT/ML ~~LOC~~ SOLN: 0.0000 [IU] | Freq: Three times a day (TID) | SUBCUTANEOUS | Status: DC

## 2013-09-08 MED ORDER — VANCOMYCIN HCL IN DEXTROSE 750-5 MG/150ML-% IV SOLN
750.0000 mg | Freq: Two times a day (BID) | INTRAVENOUS | Status: DC
  Administered 2013-09-08 – 2013-09-11 (×7): 750 mg via INTRAVENOUS
  Filled 2013-09-08 (×8): qty 150

## 2013-09-08 MED ORDER — FENTANYL BOLUS VIA INFUSION
25.0000 ug | INTRAVENOUS | Status: DC | PRN
  Administered 2013-09-08 – 2013-09-10 (×3): 50 ug via INTRAVENOUS
  Administered 2013-09-10 (×2): 25 ug via INTRAVENOUS
  Filled 2013-09-08 (×2): qty 50

## 2013-09-08 MED ORDER — MIDAZOLAM HCL 2 MG/2ML IJ SOLN
1.0000 mg | Freq: Once | INTRAMUSCULAR | Status: AC
  Administered 2013-09-08: 1 mg via INTRAVENOUS

## 2013-09-08 MED ORDER — AMIODARONE HCL IN DEXTROSE 360-4.14 MG/200ML-% IV SOLN
30.0000 mg/h | INTRAVENOUS | Status: DC
  Administered 2013-09-08 – 2013-09-11 (×8): 30 mg/h via INTRAVENOUS
  Filled 2013-09-08 (×15): qty 200

## 2013-09-08 MED ORDER — HEPARIN (PORCINE) IN NACL 100-0.45 UNIT/ML-% IJ SOLN
1250.0000 [IU]/h | INTRAMUSCULAR | Status: DC
  Administered 2013-09-08: 1000 [IU]/h via INTRAVENOUS
  Administered 2013-09-09: 1250 [IU]/h via INTRAVENOUS
  Filled 2013-09-08 (×3): qty 250

## 2013-09-08 MED ORDER — BIOTENE DRY MOUTH MT LIQD
15.0000 mL | Freq: Two times a day (BID) | OROMUCOSAL | Status: DC
  Administered 2013-09-08 – 2013-09-09 (×3): 15 mL via OROMUCOSAL

## 2013-09-08 MED ORDER — SODIUM CHLORIDE 0.9 % IV SOLN
INTRAVENOUS | Status: DC
  Administered 2013-09-08: 06:00:00 via INTRAVENOUS

## 2013-09-08 MED ORDER — NYSTATIN 100000 UNIT/GM EX CREA
TOPICAL_CREAM | Freq: Three times a day (TID) | CUTANEOUS | Status: DC
  Administered 2013-09-08 – 2013-09-14 (×9): via TOPICAL
  Filled 2013-09-08 (×4): qty 15

## 2013-09-08 MED ORDER — FENTANYL CITRATE 0.05 MG/ML IJ SOLN
25.0000 ug | Freq: Once | INTRAMUSCULAR | Status: AC
  Administered 2013-09-08: 25 ug via INTRAVENOUS

## 2013-09-08 MED ORDER — MIDAZOLAM BOLUS VIA INFUSION
2.0000 mg | INTRAVENOUS | Status: DC | PRN
  Filled 2013-09-08: qty 2

## 2013-09-08 MED ORDER — AZITHROMYCIN 500 MG IV SOLR
500.0000 mg | INTRAVENOUS | Status: DC
  Filled 2013-09-08: qty 500

## 2013-09-08 MED ORDER — MIDAZOLAM HCL 2 MG/2ML IJ SOLN
INTRAMUSCULAR | Status: AC
  Filled 2013-09-08: qty 2

## 2013-09-08 MED ORDER — DEXTROSE 5 % IV SOLN
2.0000 g | INTRAVENOUS | Status: DC
  Administered 2013-09-08 – 2013-09-14 (×7): 2 g via INTRAVENOUS
  Filled 2013-09-08 (×8): qty 2

## 2013-09-08 MED ORDER — DEXTROSE 5 % IV SOLN
1.0000 g | Freq: Two times a day (BID) | INTRAVENOUS | Status: DC
  Filled 2013-09-08: qty 1

## 2013-09-08 MED ORDER — FENTANYL CITRATE 0.05 MG/ML IJ SOLN
50.0000 ug | INTRAMUSCULAR | Status: DC | PRN
  Administered 2013-09-08: 50 ug via INTRAVENOUS
  Filled 2013-09-08: qty 2

## 2013-09-08 MED ORDER — SODIUM BICARBONATE 8.4 % IV SOLN
INTRAVENOUS | Status: AC
  Administered 2013-09-08: 50 meq via INTRAVENOUS
  Filled 2013-09-08: qty 50

## 2013-09-08 MED ORDER — NOREPINEPHRINE BITARTRATE 1 MG/ML IV SOLN
2.0000 ug/min | INTRAVENOUS | Status: DC
  Administered 2013-09-08: 20 ug/min via INTRAVENOUS
  Administered 2013-09-09: 6 ug/min via INTRAVENOUS
  Administered 2013-09-10: 8 ug/min via INTRAVENOUS
  Administered 2013-09-10: 7 ug/min via INTRAVENOUS
  Filled 2013-09-08 (×6): qty 8

## 2013-09-08 MED ORDER — SODIUM CHLORIDE 0.9 % IV SOLN
0.0000 ug/h | INTRAVENOUS | Status: DC
  Administered 2013-09-08: 50 ug/h via INTRAVENOUS
  Administered 2013-09-09 – 2013-09-11 (×2): 100 ug/h via INTRAVENOUS
  Filled 2013-09-08 (×5): qty 50

## 2013-09-08 MED ORDER — SODIUM BICARBONATE 8.4 % IV SOLN
50.0000 meq | Freq: Once | INTRAVENOUS | Status: AC
  Administered 2013-09-08: 50 meq via INTRAVENOUS
  Filled 2013-09-08: qty 50

## 2013-09-08 MED ORDER — CHLORHEXIDINE GLUCONATE 0.12 % MT SOLN
15.0000 mL | Freq: Two times a day (BID) | OROMUCOSAL | Status: DC
  Administered 2013-09-08 – 2013-09-13 (×11): 15 mL via OROMUCOSAL
  Filled 2013-09-08 (×11): qty 15

## 2013-09-08 MED ORDER — FENTANYL CITRATE 0.05 MG/ML IJ SOLN: 50.0000 ug | Freq: Once | INTRAMUSCULAR | Status: AC

## 2013-09-08 MED ORDER — FENTANYL CITRATE 0.05 MG/ML IJ SOLN
INTRAMUSCULAR | Status: AC
  Filled 2013-09-08: qty 2

## 2013-09-08 NOTE — Progress Notes (Signed)
HR 58-62 NSR w PACs. BP 02/27/71 on art-line. Dr. Genevieve Norlander notified, no further orders.

## 2013-09-08 NOTE — Progress Notes (Signed)
Found a large amount of amber colored fluid in the bed saturating the bedsheets from the upper legs to perineal area. Smell like urine.

## 2013-09-08 NOTE — Progress Notes (Signed)
Brief Note:  Pt admitted early am w/ septic shock/Afib w/ RVR, PNA  Weaning well off pressors . HR controlled rate . CVP 11-18  Will continue current therapy  Repeat ABG now . Repeat bmet 1500  Labs /cxr in am ordered.  Monitor UOP closely   Tammy Parrett NP-C  [facility]  [phone]

## 2013-09-08 NOTE — Progress Notes (Addendum)
PULMONARY  / CRITICAL CARE MEDICINE  Name: Cathy Robles MRN: [id number] DOB: 1941-04-20 PCP Bennie Pierini, FNP   ADMISSION DATE:  08/27/2013 LOS 0 days  REFERRING MD :  [facility] PRIMARY SERVICE: CCM  CHIEF COMPLAINT:  PNA/Respiratory failure  BRIEF PATIENT DESCRIPTION:    73 y/o obese woman with PAF, CHF due to NICM EF 20%, LBBB, chronic pain syndrome, hypothyroidism and COPD with ongoing tobacco use admitted from [facility] on 09/11/2013 for respiratory failure due to bilateral PNA.   Admitted in 2011 with respiratory failure and CHF in setting of AF with RVR. Echo Ef 25%. Underwent cath. Normal coronary arteries EF 40%. Underwent DC-CV. Placed on amio and Pradaxa. Last saw Dr. Antoine Poche in 2012 and was in NSR.  According to her family she lives in her rcliner and only moves to get up to go to bedside commode. Has chronic pain and takes [employer] frequently but not taking any of her other meds. Has not seen a doctor in years. Continues to smoke 1-1.5 ppd. Has chronic cough. Over past week cough much worse and more dyspneic. Has had wife sputum and occasional blood tinged sputum. Occasion CP but unchanged. Daughter came to visit today and was severely dyspneic and called 911. Taken to [facility]. Was hypotensive (SBP 60s) and in extreme respiratory distress as well as AF with RVR.  Intubated in ER. CXR with bilateral infiltrates PNA vs CHF. Treated with Vancomycin and Levofloxacin. Underwent emergent DC-CV of AF. Started on neo without much benefit. Levophed added in ambulance.   On arrival, awake and following commands. Central line placed with co-ox 71%. Bedside echo LVEF 15-20%. RV normal.   Labs from [place]. WBC 23K. BNP 1110. Trop normal. UA negative  LINES / TUBES: RIJ CVL 08/30/2013 >>> R radial arterial line 08/30/2013>>> ETT 09/10/2013 [facility]) >>>  CULTURES: BCX x 2 (09/22/2013) >>> Sputum CX (Pending) UA (09/13/2013 >> negative at  [facility] ....... Urine strep - POSITIVE Urine leg resp virus pcr tracheal aspirate   ANTIBIOTICS: Vancomycin 08/27/2013 >>> Cefipime 08/29/13 >>>5/17, CEftiraxone 5/17 >>  Levofloxacin x 1 at [facility] 08/29/13 >>> d/c'd due to reported allergy  PCN alllergy (hives) on chart   SIGNIFICANT EVENTS / STUDIES:  Intubated 09/13/2013 DC[facility]CV of AF at [facility] 09/19/2013    SUBJECTIVE/OVERNIGHT/INTERVAL HX  09/10/2013 PM rounds: continued pressor needs. On  70% fio2, peep 5. On Iv amio and heparin. C/o back pain. Daughter concerned about left hand edema   VITAL SIGNS: Filed Vitals:   09/15/2013 1000 09/14/2013 1100 09/15/2013 1147 08/28/2013 1200  BP: 97/78 83/61  92/55  Pulse: 79 60  55  Temp:   96.4 F (35.8 C)   TempSrc:   Axillary   Resp: 22 22  22   Height:      Weight:      SpO2: 100% 100%  100%      HEMODYNAMICS:   VENTILATOR SETTINGS: Vent Mode:  [-] PRVC FiO2 (%):  [30 %] 30 % Set Rate:  [28 bmp] 28 bmp Vt Set:  [420 mL] 420 mL PEEP:  [5 cmH20] 5 cmH20 Plateau Pressure:  [4 cmH20-21 cmH20] 20 cmH20 INTAKE / OUTPUT: I/O last 3 completed shifts: In: 127.7 [I.V.:127.7] Out: 100 [Urine:100]   PHYSICAL EXAMINATION: General:  Chronically and critically ill appearing.  HEENT: normal except for poor dentition and ETT Neck: supple. Thick. Unable to assess JVD. Carotids 2+ bilat; no bruits. No lymphadenopathy or thryomegaly appreciated. Cor: PMI nonpalpable. Regular rate & rhythm. Distant HS  No obvious rubs, gallops or murmurs. Fungal rash on chest Lungs: + rhonchi Abdomen: obese soft, nontender, nondistended. No hepatosplenomegaly. No bruits or masses. Good bowel sounds. Extremities: no cyanosis, clubbing, rash. Cool trace edema Neuro: . Follows commands. Nonfocal, RASS -1  LABS: PULMONARY  Recent Labs Lab 08/23/2013 0255 09/21/2013 0354 09/07/2013 0946  PHART  --  7.191* 7.352  PCO2ART  --  47.1* 36.9  PO2ART  --  125.0* 144.0*  HCO3  --  17.4* 20.7  TCO2  --  18.8 22  O2SAT  70.5 96.1 99.0    CBC  Recent Labs Lab 09/09/2013 0300 09/10/2013 0500  HGB 11.5* 11.8*  HCT 36.6 38.0  WBC 22.6* 21.6*  PLT 144* 128*    COAGULATION No results found for this basename: INR,  in the last 168 hours  CARDIAC    Recent Labs Lab 09/07/2013 0309 09/18/2013 0907  TROPONINI <0.30 0.70*    Recent Labs Lab 08/25/2013 0343  PROBNP 10714.0*     CHEMISTRY  Recent Labs Lab 08/28/2013 0300 08/27/2013 0500  NA 132* 132*  K 4.4 4.7  CL 100 100  CO2 15* 17*  GLUCOSE 177* 166*  BUN 44* 49*  CREATININE 0.94 1.04  CALCIUM 7.0* 7.5*   Estimated Creatinine Clearance: 55.5 ml/min (by C-G formula based on Cr of 1.04).   LIVER  Recent Labs Lab 09/20/2013 0300  AST 678*  ALT 495*  ALKPHOS 89  BILITOT 1.2  PROT 5.1*  ALBUMIN 2.1*     INFECTIOUS  Recent Labs Lab 09/21/2013 0308 08/29/2013 0309  LATICACIDVEN 2.5*  --   PROCALCITON  --  0.78     ENDOCRINE CBG (last 3)   Recent Labs  08/23/2013 0317 08/25/2013 0757 08/28/2013 1143  GLUCAP 173* 136* 118*      IMAGING x48h  Dg Chest Port 1 View  09/21/2013   CLINICAL DATA:  Endotracheal tube advanced 2 cm.  EXAM: PORTABLE CHEST - 1 VIEW  COMPARISON:  Earlier today.  FINDINGS: The endotracheal tube is in satisfactory position and slightly lower than previously demonstrated. The remainder to of the examination is unchanged.  IMPRESSION: No significant change in probable bilateral pneumonia, pleural effusions and cardiomegaly.   Electronically Signed   By: Gordan Payment M.D.   On: 09/17/2013 03:38   Dg Chest Port 1 View  09/15/2013   CLINICAL DATA:  Line placement.  EXAM: PORTABLE CHEST - 1 VIEW  COMPARISON:  12/30/2009.  FINDINGS: Dense patchy airspace opacity in both mid and lower lung zones. Probable bilateral pleural effusions. Endotracheal tube in satisfactory position. Right jugular catheter tip in the inferior aspect of the superior vena cava. No pneumothorax. Nasogastric tube extending into the stomach.  Probable enlarged cardiac silhouette, difficult to assess due to the airspace opacity. Diffuse osteopenia. Stable surgical absence or distortion of the distal clavicles.  IMPRESSION: 1. Dense bilateral airspace opacity, most compatible with pneumonia. 2. Probable bilateral pleural effusions. 3. Probable cardiomegaly.   Electronically Signed   By: Gordan Payment M.D.   On: 08/30/2013 03:33       ASSESSMENT / PLAN:  PULMONARY A: 1) VDRF likely due to bilateral CAP -> septic shock,      2) Acute/chronic systolic HF d/t NICM EF 15-20%  - Needing 70% fi02. CXR concerning for empyema right side  P:   Full vent support Might Need ARDS protocol Check CT chest - rule out empyema   CARDIOVASCULAR A: 1) PAF with RVR - s/p emergent DC-CV at [facility]  09/15/2013      2) Acute/chronic systolic HF d/t NICM EF 15-20%      3) LBBB  4) [facility] - ef 15%   - continued pressor need. Left hand with edema +. QTc  >52900msec P:  Continue  amio and heparin.  Await  formal echo.  Levophed for BP support as needed.  Diurese as tolerated.  Follow CVPs and co-ox.  Cycle cardiac markers Check Ue Doppler (LEft hand with edema)  RENAL A: Making urine. At risk for ATN P Monitor   GI A NPO P Start tube feeds  ID A:  1) Septic shock       2) CAP - severe pneumococcal CAP       3) PCN and levofloxacin allergy   - DX made of pneumococcal CAP, severe with septic shock. MRSA PCR positive  P:   Abx as above.; see changes in flow sheet above  Check PCT.  BCX and sputum cx.  Check resp virus PCR    ENDOCRINE  A:  1) h/o hypothyroidism - non compliant P:     With check TSH/T4 - exclude component of myxedema.    DERM A: 1) Fungal rash P: Nystatin   CODE STATUS:  According to family patient often says she wishes she was dead and would not want life support. However daughter wants patient to remain FULL CODE.   GLOBAL 5./17;/15: Daughter at bedside updated. She is exhibiting signs  of significant spiritual distress ; extreme worry about patient, focusnig on small physical signs, not accepting patient wishes to be DNAR as above. Will need to explore sources of her distress.     The patient is critically ill with multiple organ systems failure and requires high complexity decision making for assessment and support, frequent evaluation and titration of therapies, application of advanced monitoring technologies and extensive interpretation of multiple databases.   Critical Care Time devoted to patient care services described in this note is  45 Minutes.  Dr. Kalman ShanMurali Vanden Fawaz, M.D., [facility].P Pulmonary and Critical Care Medicine Staff Physician [facility] [facility] Pager: [phone], If no answer or between  15:00h - 7:00h: call [phone]  09/21/2013 1:33 PM

## 2013-09-08 NOTE — Consult Note (Signed)
CARDIOLOGY CONSULT NOTE  Patient ID: Cathy Robles MRN: [id number] DOB/AGE: Dec 26, 1940 73 y.o.  Admit date: 08/27/2013 Primary Physician Dr. Lysbeth Galas Primary Cardiologist  None Chief Complaint  Atrial fib  HPI:  This unfortunate patient is well known to me.  She has severe lung disease and ongoing tobacco addiction.  She has not been seen by cardiology in about 3 years.  She was discharged from our practice as she habitually did not come to her appointments.  (She no showed or had a same day cancellation for 9 of 11 appointments from 2/12 - 8/13).  She has a cardiomyopathy with a previous EF of 20 - 25% felt to be nonischemic and related to atrial fib.  She has had atrial fib paroxysmal.    She was transferred from [facility] with bilateral pneumonia and septic shock.  She was intub[facility] at [facility].  She was treated with antibiotics and was cardioverted to try to improve hemodynamics.  On transfer her bedside echo showed an EF of 15 - 20%.  Of note her co ox was 72.   The patient is intubated and sedated.  Outside records reviewed.  The family is not at beside.    Past Medical History  Diagnosis Date  . Atrial fibrillation with rapid ventricular response   . LBBB (left bundle branch block)     Chronic  . History of chest pain     a.  2011 Cath:  Minimal Coronary Plaque  . Hiatal hernia   . GERD (gastroesophageal reflux disease)   . Asthma   . Current tobacco use     Currently smoking 1 pack per day  . DJD (degenerative joint disease)   . Spinal stenosis     At L5-S1  . Chronic back pain   . Osteoarthritis   . Anxiety   . Hypertension   . Hyperlipidemia   . Colon polyps   . Hypothyroidism   . Fibromyalgia   . History of headache   . Cardiomyopathy 2011    a. Possibly related to Afib with RVR;  b. 12/2009 Echo: EF 20-25%, anteroseptal, apical, inferior AK. Mild MR. Mildly dilated LA.    Past Surgical History  Procedure Laterality Date  . Tonsillectomy and adenoidectomy      As a child  . Tubal ligation    . Partial hysterectomy    . Bladder tack    . Rotator cuff repair      Right  . 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 Lumbar laminectomy/decompression microdiscectomy      Lumbar decompression  . Total knee arthroplasty  2002    Left  . Cervical discectomy      Details unknown    Allergies  Allergen Reactions  . Codeine     REACTION: itch with large doses  . Iodine Itching    Felt hot  . Levofloxacin     REACTION: itch  . Meperidine Hcl     REACTION: nausea  . Methadone     REACTION: ? confusion  . Penicillins     REACTION: hives  . Sulfonamide Derivatives     REACTION: nausea,vomiting,itching   Prescriptions prior to admission  Medication Sig Dispense Refill  . Aspirin-Acetaminophen-Caffeine (GOODY HEADACHE PO) Take 1 packet by mouth every 6 (six) hours.      . Cyanocobalamin (VITAMIN B-12 PO) Take 1 capsule by mouth daily.      . Fish Oil OIL Take 1  capsule by mouth daily.       Marland Kitchen. VITAMIN A PO Take 1 capsule by mouth daily.       Marland Kitchen. VITAMIN E PO Take 1 capsule by mouth daily.       Family History  Problem Relation Age of Onset  . Osteoarthritis Other     family history  . Colon cancer Other     family history  . Diabetes Other     family history    History   Social History  . Marital Status: Married    Spouse Name: N/A    Number of Children: 2  . Years of Education: N/A   Occupational History  . Not on file.   Social History Main Topics  . Smoking status: Current Every Day Smoker  . Smokeless tobacco: Not on file     Comment: Denies tobacco use  . Alcohol Use: Yes     Comment: Rare  . Drug Use: Not on file  . Sexual Activity: Not on file   Other Topics Concern  . Not on file   Social History Narrative   Her husband will be available to help with her postoperative course. He works, but will take a could of days off after her surgery to help her upon returning home, and then will be able to help  her as needed throughout the day if she calls him.     ROS:  Intubated.  Unable to obtain.  Physical Exam: Blood pressure 83/48, pulse 58, temperature 96.4 F (35.8 C), temperature source Axillary, resp. rate 22, height 5' (1.524 m), weight 234 lb 12.8 oz (106.505 kg), SpO2 100.00%.  GENERAL:  Frail appearing and critically ill.  Intubated ans sedated.  She opens her eyes and becomes agitated.  HEENT:  Pupils equal round and reactive, fundi not visualized, oral mucosa unremarkable NECK:  No jugular venous distention, waveform within normal limits, carotid upstroke brisk and symmetric, no bruits, no thyromegaly LYMPHATICS:  No cervical, inguinal adenopathy LUNGS:  Clear to auscultation bilaterally BACK:  No CVA tenderness CHEST:  Unremarkable HEART:  PMI not displaced or sustained,S1 and S2 within normal limits, no S3, no clicks, no rubs, no murmurs (distant heart sounds), irregular ABD:  Flat, positive bowel sounds normal in frequency in pitch, no bruits, no rebound, no guarding, no midline pulsatile mass, no hepatomegaly, no splenomegaly EXT:  2 plus pulses throughout, no edema, no cyanosis no clubbing SKIN:  No rashes no nodules NEURO:  Sedated.  Labs: Lab Results  Component Value Date   BUN 49* 09/19/2013   Lab Results  Component Value Date   CREATININE 1.04 09/04/2013   Lab Results  Component Value Date   NA 132* 09/15/2013   K 4.7 09/20/2013   CL 100 09/02/2013   CO2 17* 08/26/2013   Lab Results  Component Value Date   TROPONINI 0.70* 08/23/2013   Lab Results  Component Value Date   WBC 21.6* 09/09/2013   HGB 11.8* 09/15/2013   HCT 38.0 09/02/2013   MCV 92.2 08/29/2013   PLT 128* 08/26/2013    Lab Results  Component Value Date   ALT 495* 09/15/2013   AST 678* 09/21/2013   ALKPHOS 89 09/14/2013   BILITOT 1.2 09/22/2013    Radiology:   No significant change in probable bilateral pneumonia, pleural  effusions and cardiomegaly  EKG:  NSR, LBBB, no change from previous.   09/13/2013  ASSESSMENT AND PLAN:   RESPIRATORY ARREST:  Pneumococcal  pneumonia with septic  shock.  Questionable component of acute on chronic systolic HF.    CT has been ordered.    NONISCHEMIC CARDIOMYOPATHY:  As above.  Unable to titrate meds.  I agree with not diuresing her at present as I think that sepsis is the predominant physiology.    TROPONIN ELEVATION:  Probably secondary to respiratory failure.  Continue to follow and order full echo.   ATRIAL FIB:  Agree with heparin and IV amiodarone.   Her is sinus brady.  However, I don't think that she would tolerate atrial fib.  Continue IV amio for now.   HYPOTHYROIDISM:  Noncompliant.  23.59.  Replacement per CCM.    SignedRollene Rotunda 09/10/2013, 2:16 PM

## 2013-09-08 NOTE — Progress Notes (Signed)
RT note- patient transported to CT without difficulty and returned amp placed back to full ventilator settings.

## 2013-09-08 NOTE — Procedures (Signed)
Arterial Catheter Insertion Procedure Note SHANTERICA KRANING [id number] 11-Oct-1940  Procedure: Insertion of Arterial Catheter  Indications: Blood pressure monitoring  Procedure Details Consent: Unable to obtain consent because of altered level of consciousness. Time Out: Verified patient identification, verified procedure, site/side was marked, verified correct patient position, special equipment/implants available, medications/allergies/relevent history reviewed, required imaging and test results available.  Performed  Maximum sterile technique was used including antiseptics, cap, gloves, gown, hand hygiene, mask and sheet. Skin prep: Chlorhexidine; local anesthetic administered 20 gauge catheter was inserted into right radial artery using the Seldinger technique.  Normal saline flush used.  Site dressed and secured per RT protocol.    Evaluation Blood flow good; BP tracing good. Complications: No apparent complications .   Dolphus Jenny [facility] 2013-10-01

## 2013-09-08 NOTE — Progress Notes (Signed)
Pharmacy Consult Note  Pharmacy Consult for Heparin Indication: atrial fibrillation  Patient Measurements: Height: 5' (152.4 cm) Weight: 234 lb 12.8 oz (106.505 kg) IBW/kg (Calculated) : 45.5 Dosing Weight: 70 kg  Vital Signs: Temp: 96.3 F (35.7 C) (05/17 1545) Temp src: Axillary (05/17 1545) BP: 93/54 mmHg (05/17 1500) Pulse Rate: 58 (05/17 1400)  Labs: SCr 1.24 (at [facility])  PT/INR 19.5/1.7  (at [facility])   Recent Labs  2013/10/05 0300 10/05/2013 0309 05-Oct-2013 0500 Oct 05, 2013 0907 Oct 05, 2013 1400 Oct 05, 2013 1435  HGB 11.5*  --  11.8*  --   --   --   HCT 36.6  --  38.0  --   --   --   PLT 144*  --  128*  --   --   --   HEPARINUNFRC  --   --   --   --  0.20*  --   CREATININE 0.94  --  1.04  --   --  1.07  TROPONINI  --  <0.30  --  0.70*  --   --     Estimated Creatinine Clearance: 54 ml/min (by C-G formula based on Cr of 1.07).  Medications:  Noncompliant--previously on Pradaxa  Assessment: 73 yo female with PAF now s/p DCCV early this am. Patient currently in NSR. Heparin level is below goal at first check. No bleeding issues noted, will titrate dose.  Goal of Therapy:  Vancomycin trough 15-20 Heparin level 0.3-0.7 units/ml Monitor platelets by anticoagulation protocol: Yes   Plan:  Increase Heparin to 1250 units/hr Check heparin level in 8 hours.   Sheppard Coil PharmD., BCPS Clinical Pharmacist Pager [phone] 10/05/2013 3:55 PM

## 2013-09-08 NOTE — H&P (Addendum)
PULMONARY  / CRITICAL CARE MEDICINE  Name: Cathy Robles MRN: [id number] DOB: 1940-12-01 PCP Bennie Pierini, FNP   ADMISSION DATE:  09/13/13 LOS 0 days  REFERRING MD :  [facility] PRIMARY SERVICE: CCM  CHIEF COMPLAINT:  PNA/Respiratory failure  BRIEF PATIENT DESCRIPTION: 73 y/o obese woman with PAF, CHF due to NICM EF 20%, LBBB, chronic pain syndrome and COPD with ongoing tobacco use admitted from [facility] on 2013/09/13 for respiratory failure and septic shock due to bilateral PNA.   LINES / TUBES: RIJ CVL 13-Sep-2013 >>> R radial arterial line 09/13/2013>>> ETT Sep 13, 2013 [facility]) >>>  CULTURES: BCX x 2 (09/13/2013) >>> Sputum CX (Pending) UA (09-13-13 >> negative at [facility]  ANTIBIOTICS: Vancomycin 09/13/2013 >>> Cefipime 08/29/13 >>> Levofloxacin x 1 at [facility] 08/29/13 >>> d/c'd due to reported allergy  PCN alllergy (hives) on chart   SIGNIFICANT EVENTS / STUDIES:  Intubated 09-13-13 DC-CV of AF at [facility] Sep 13, 2013   HISTORY OF PRESENT ILLNESS:    73 y/o obese woman with PAF, CHF due to NICM EF 20%, LBBB, chronic pain syndrome, hypothyroidism and COPD with ongoing tobacco use admitted from [facility] on 2013-09-13 for respiratory failure due to bilateral PNA.   Admitted in 2011 with respiratory failure and CHF in setting of AF with RVR. Echo Ef 25%. Underwent cath. Normal coronary arteries EF 40%. Underwent DC-CV. Placed on amio and Pradaxa. Last saw Dr. Antoine Poche in 2012 and was in NSR.  According to her family she lives in her rcliner and only moves to get up to go to bedside commode. Has chronic pain and takes [employer] frequently but not taking any of her other meds. Has not seen a doctor in years. Continues to smoke 1-1.5 ppd. Has chronic cough. Over past week cough much worse and more dyspneic. Has had wife sputum and occasional blood tinged sputum. Occasion CP but unchanged. Daughter came to visit today and was severely dyspneic and called 911.  Taken to [facility]. Was hypotensive (SBP 60s) and in extreme respiratory distress as well as AF with RVR.  Intubated in ER. CXR with bilateral infiltrates PNA vs CHF. Treated with Vancomycin and Levofloxacin. Underwent emergent DC-CV of AF. Started on neo without much benefit. Levophed added in ambulance.   On arrival, awake and following commands. Central line placed with co-ox 71%. Bedside echo LVEF 15-20%. RV normal.   Labs from [place]. WBC 23K. BNP 1110. Trop normal. UA negative    PAST MEDICAL HISTORY :  Past Medical History  Diagnosis Date  . Atrial fibrillation with rapid ventricular response   . LBBB (left bundle branch block)     Chronic  . History of chest pain     a.  2011 Cath:  Minimal Coronary Plaque  . Hiatal hernia   . GERD (gastroesophageal reflux disease)   . Asthma   . Current tobacco use     Currently smoking 1 pack per day  . DJD (degenerative joint disease)   . Spinal stenosis     At L5-S1  . Chronic back pain   . Osteoarthritis   . Anxiety   . Hypertension   . Hyperlipidemia   . Colon polyps   . Hypothyroidism   . Fibromyalgia   . History of headache   . Cardiomyopathy 2011    a. Possibly related to Afib with RVR;  b. 12/2009 Echo: EF 20-25%, anteroseptal, apical, inferior AK. Mild MR. Mildly dilated LA.     Family History  Problem Relation Age  of Onset  . Osteoarthritis Other     family history  . Colon cancer Other     family history  . Diabetes Other     family history     History   Social History  . Marital Status: Married    Spouse Name: N/A    Number of Children: 2  . Years of Education: N/A   Occupational History  . Not on file.   Social History Main Topics  . Smoking status: Current Every Day Smoker  . Smokeless tobacco: Not on file     Comment: Denies tobacco use  . Alcohol Use: Yes     Comment: Rare  . Drug Use: Not on file  . Sexual Activity: Not on file   Other Topics Concern  . Not on file   Social  History Narrative   Her husband will be available to help with her postoperative course. He works, but will take a could of days off after her surgery to help her upon returning home, and then will be able to help her as needed throughout the day if she calls him.     Allergies  Allergen Reactions  . Codeine     REACTION: itch with large doses  . Levofloxacin     REACTION: itch  . Meperidine Hcl     REACTION: nausea  . Methadone     REACTION: ? confusion  . Penicillins     REACTION: hives  . Sulfonamide Derivatives     REACTION: nausea,vomiting,itching      (Not in an outpatient encounter)   REVIEW OF SYSTEMS:  Not available except as per HPI. (Patient intubated)  SUBJECTIVE:   VITAL SIGNS: Filed Vitals:   08/31/2013 0200  Height: 5' (1.524 m)      HEMODYNAMICS:   VENTILATOR SETTINGS: Vent Mode:  [-] PRVC FiO2 (%):  [30 %] 30 % Set Rate:  [28 bmp] 28 bmp Vt Set:  [420 mL] 420 mL PEEP:  [5 cmH20] 5 cmH20 Plateau Pressure:  [4 cmH20-21 cmH20] 20 cmH20 INTAKE / OUTPUT:     PHYSICAL EXAMINATION: General:  Chronically ill appearing. Awake on vent HEENT: normal except for poor dentition and ETT Neck: supple. Thick. Unable to assess JVD. Carotids 2+ bilat; no bruits. No lymphadenopathy or thryomegaly appreciated. Cor: PMI nonpalpable. Regular rate & rhythm. Distant HS No obvious rubs, gallops or murmurs. Fungal rash on chest Lungs: + rhonchi Abdomen: obese soft, nontender, nondistended. No hepatosplenomegaly. No bruits or masses. Good bowel sounds. Extremities: no cyanosis, clubbing, rash. Cool trace edema Neuro: Awake on vent. Follows commands. Nonfocal  LABS: PULMONARY  Recent Labs Lab 09/15/2013 0255  O2SAT 70.5    CBC  Recent Labs Lab 08/23/2013 0300  HGB 11.5*  HCT 36.6  WBC 22.6*  PLT 144*    COAGULATION No results found for this basename: INR,  in the last 168 hours  CARDIAC  No results found for this basename: TROPONINI,  in the last 168  hours No results found for this basename: PROBNP,  in the last 168 hours   CHEMISTRY No results found for this basename: NA, K, CL, CO2, GLUCOSE, BUN, CREATININE, CALCIUM, MG, PHOS,  in the last 168 hours The CrCl is unknown because both a height and weight (above a minimum accepted value) are required for this calculation.   LIVER No results found for this basename: AST, ALT, ALKPHOS, BILITOT, PROT, ALBUMIN, INR,  in the last 168 hours   INFECTIOUS No results found  for this basename: LATICACIDVEN, PROCALCITON,  in the last 168 hours   ENDOCRINE CBG (last 3)  No results found for this basename: GLUCAP,  in the last 72 hours    IMAGING x48h  Dg Chest Port 1 View  Apr 02, 2014   CLINICAL DATA:  Endotracheal tube advanced 2 cm.  EXAM: PORTABLE CHEST - 1 VIEW  COMPARISON:  Earlier today.  FINDINGS: The endotracheal tube is in satisfactory position and slightly lower than previously demonstrated. The remainder to of the examination is unchanged.  IMPRESSION: No significant change in probable bilateral pneumonia, pleural effusions and cardiomegaly.   Electronically Signed   By: Gordan PaymentSteve  Reid M.D.   On: 0Dec 09, 2015 03:38   Dg Chest Port 1 View  Apr 02, 2014   CLINICAL DATA:  Line placement.  EXAM: PORTABLE CHEST - 1 VIEW  COMPARISON:  12/30/2009.  FINDINGS: Dense patchy airspace opacity in both mid and lower lung zones. Probable bilateral pleural effusions. Endotracheal tube in satisfactory position. Right jugular catheter tip in the inferior aspect of the superior vena cava. No pneumothorax. Nasogastric tube extending into the stomach. Probable enlarged cardiac silhouette, difficult to assess due to the airspace opacity. Diffuse osteopenia. Stable surgical absence or distortion of the distal clavicles.  IMPRESSION: 1. Dense bilateral airspace opacity, most compatible with pneumonia. 2. Probable bilateral pleural effusions. 3. Probable cardiomegaly.   Electronically Signed   By: Gordan PaymentSteve  Reid M.D.   On:  0Dec 09, 2015 03:33       ASSESSMENT / PLAN:  PULMONARY A: 1) VDRF likely due to bilateral CAP -> septic shock      2) Acute/chronic systolic HF d/t NICM EF 15-20% P:   Main issue seems to be PNA with septic shock. Given PCN and Levofloxacin allergy will cover with vancomycin and cefimpime. May also have CHF component but co-ox ok. Can diurese as needed. Check sputum and BCx. Ventilator bundle. Pulmonary toilet.   CARDIOVASCULAR A: 1) PAF with RVR - [facility]      2) Acute/chronic systolic HF d/t NICM EF 15-20%      3) LBBB P:  Now in NSR after DC-CV. Start amio and heparin. Bedside echo EF 15-20%. Will order formal echo. Levophed for BP support as needed. Diurese as tolerated. Follow CVPs and co-ox. Cycle cardiac markers  ID A:  1) Septic shock       2) CAP        3) PCN and levofloxacin allergy P:   Abx as above. Levophed as needed for BP support. Check PCT. BCX and sputum cx.    ENDOCRINE A:  1) h/o hypothyroidism  P:   She has been non-complaint with thyroid replacement.  With check TSH/T4 - exclude component of myxedema.    DERM A: 1) Fungal rash P: Nystatin  F/E/N:  P: Keep K+ >= 4.0 Mag >= 2.0  Prophylaxis P: On heparin. Start PPI. Cover with SSI.   CODE STATUS:  According to family patient often says she wishes she was dead and would not want life support. However daughter wants patient to remain FULL CODE.   The patient is critically ill with multiple organ systems failure and requires high complexity decision making for assessment and support, frequent evaluation and titration of therapies, application of advanced monitoring technologies and extensive interpretation of multiple databases.   Critical Care Time devoted to patient care services described in this note is  60 Minutes.   Nicholes Mangoan Navaya Wiatrek, MD Critical Care Medicine [facility]  Pager: [phone], If no answer or between  15:00h - 7:00h: call  [phone] 09/21/2013 3:47 AM

## 2013-09-08 NOTE — Procedures (Signed)
Central Venous Catheter Insertion Procedure Note SHARADA SCHILTZ [id number] Feb 14, 1941  Procedure: Insertion of Central Venous Catheter Indications: IV access  Procedure Details Consent: Unable to obtain consent because of emergent medical necessity. Time Out: Verified patient identification, verified procedure, site/side was marked, verified correct patient position, special equipment/implants available, medications/allergies/relevent history reviewed, required imaging and test results available.  Performed  Maximum sterile technique was used including antiseptics, cap, gloves, gown, hand hygiene, mask and sheet. Skin prep: Chlorhexidine; local anesthetic administered A antimicrobial bonded/coated triple lumen catheter was placed in the right internal jugular vein using the Seldinger technique.  Evaluation Blood flow good Complications: No apparent complications Patient did tolerate procedure well. Chest X-ray ordered to verify placement.  CXR: normal.  Bevelyn Buckles Bensimhon 15-Sep-2013, 4:52 PM

## 2013-09-08 NOTE — Progress Notes (Signed)
CRITICAL VALUE ALERT  Critical value received:  Troponin 0.70  Date of notification:  09/14/2013  Time of notification:  1045     Critical value read back:yes  Nurse who received alert:  Ladora Daniel  MD notified (1st page):  Tammy Parrett, N.P.  Time of first page:  1050

## 2013-09-08 NOTE — Progress Notes (Signed)
CONSULT NOTE - Initial Consult  Pharmacy Consult for Vancomycin/Cefepime and Heparin Indication: sepsis and atrial fibrillation  Allergies  Allergen Reactions  . Codeine     REACTION: itch with large doses  . Levofloxacin     REACTION: itch  . Meperidine Hcl     REACTION: nausea  . Methadone     REACTION: ? confusion  . Penicillins     REACTION: hives  . Sulfonamide Derivatives     REACTION: nausea,vomiting,itching    Patient Measurements: Height: 5' (152.4 cm) Weight: 234 lb 12.8 oz (106.505 kg) IBW/kg (Calculated) : 45.5 Dosing Weight: 70 kg  Vital Signs: Temp: 98.5 F (36.9 C) (05/17 0400) BP: 119/79 mmHg (05/17 0415) Pulse Rate: 68 (05/17 0545)  Labs: SCr 1.24 (at [facility])  PT/INR 19.5/1.7  (at [facility])   Recent Labs  2013/09/10 0300 Sep 10, 2013 0309 2013-09-10 0500  HGB 11.5*  --  11.8*  HCT 36.6  --  38.0  PLT 144*  --  128*  TROPONINI  --  <0.30  --     Estimated Creatinine Clearance: 57.8 ml/min (by C-G formula based on Cr of 1).   Medical History: Past Medical History  Diagnosis Date  . Atrial fibrillation with rapid ventricular response   . LBBB (left bundle branch block)     Chronic  . History of chest pain     a.  2011 Cath:  Minimal Coronary Plaque  . Hiatal hernia   . GERD (gastroesophageal reflux disease)   . Asthma   . Current tobacco use     Currently smoking 1 pack per day  . DJD (degenerative joint disease)   . Spinal stenosis     At L5-S1  . Chronic back pain   . Osteoarthritis   . Anxiety   . Hypertension   . Hyperlipidemia   . Colon polyps   . Hypothyroidism   . Fibromyalgia   . History of headache   . Cardiomyopathy 2011    a. Possibly related to Afib with RVR;  b. 12/2009 Echo: EF 20-25%, anteroseptal, apical, inferior AK. Mild MR. Mildly dilated LA.    Medications:  Noncompliant--previously on Pradaxa  Assessment: 73 yo female with VDRF/PNA/sepsis for empiric antibiotics.   Vancomycin 1 g IV  given at [facility] at midnight   Also s/p DCCV for PAF with RVR, for heparin  Goal of Therapy:  Vancomycin trough 15-20 Heparin level 0.3-0.7 units/ml Monitor platelets by anticoagulation protocol: Yes   Plan:  Vancomycin 750 mg IV q12h Cefepime 2 g IV, then 1 g IV q12h Heparin 2000 units IV bolus, then 1000 units/hr Check heparin level in 8 hours.   Gary Fleet Mabell Esguerra 2013-09-10,6:14 AM

## 2013-09-08 NOTE — Progress Notes (Signed)
RT note charting error, patient was on 70% fio2

## 2013-09-09 ENCOUNTER — Encounter (HOSPITAL_COMMUNITY): Payer: Self-pay | Admitting: Radiology

## 2013-09-09 ENCOUNTER — Inpatient Hospital Stay (HOSPITAL_COMMUNITY): Payer: Managed Care, Other (non HMO)

## 2013-09-09 DIAGNOSIS — Z9119 Patient's noncompliance with other medical treatment and regimen: Secondary | ICD-10-CM

## 2013-09-09 DIAGNOSIS — Z91199 Patient's noncompliance with other medical treatment and regimen due to unspecified reason: Secondary | ICD-10-CM

## 2013-09-09 DIAGNOSIS — R7401 Elevation of levels of liver transaminase levels: Secondary | ICD-10-CM | POA: Diagnosis present

## 2013-09-09 DIAGNOSIS — I214 Non-ST elevation (NSTEMI) myocardial infarction: Secondary | ICD-10-CM

## 2013-09-09 DIAGNOSIS — R740 Nonspecific elevation of levels of transaminase and lactic acid dehydrogenase [LDH]: Secondary | ICD-10-CM

## 2013-09-09 DIAGNOSIS — I428 Other cardiomyopathies: Secondary | ICD-10-CM

## 2013-09-09 DIAGNOSIS — A419 Sepsis, unspecified organism: Secondary | ICD-10-CM

## 2013-09-09 DIAGNOSIS — M7989 Other specified soft tissue disorders: Secondary | ICD-10-CM

## 2013-09-09 DIAGNOSIS — I5023 Acute on chronic systolic (congestive) heart failure: Secondary | ICD-10-CM

## 2013-09-09 DIAGNOSIS — R791 Abnormal coagulation profile: Secondary | ICD-10-CM | POA: Diagnosis present

## 2013-09-09 DIAGNOSIS — J9600 Acute respiratory failure, unspecified whether with hypoxia or hypercapnia: Secondary | ICD-10-CM

## 2013-09-09 DIAGNOSIS — R6521 Severe sepsis with septic shock: Secondary | ICD-10-CM

## 2013-09-09 HISTORY — DX: Patient's noncompliance with other medical treatment and regimen due to unspecified reason: Z91.199

## 2013-09-09 HISTORY — DX: Other cardiomyopathies: I42.8

## 2013-09-09 HISTORY — DX: Acute respiratory failure, unspecified whether with hypoxia or hypercapnia: J96.00

## 2013-09-09 HISTORY — DX: Morbid (severe) obesity due to excess calories: E66.01

## 2013-09-09 HISTORY — DX: Patient's noncompliance with other medical treatment and regimen: Z91.19

## 2013-09-09 HISTORY — DX: Non-ST elevation (NSTEMI) myocardial infarction: I21.4

## 2013-09-09 LAB — BASIC METABOLIC PANEL
BUN: 42 mg/dL — AB (ref 6–23)
CHLORIDE: 97 meq/L (ref 96–112)
CO2: 20 mEq/L (ref 19–32)
CREATININE: 0.93 mg/dL (ref 0.50–1.10)
Calcium: 7.8 mg/dL — ABNORMAL LOW (ref 8.4–10.5)
GFR calc Af Amer: 71 mL/min — ABNORMAL LOW (ref >90)
GFR calc non Af Amer: 61 mL/min — ABNORMAL LOW (ref >90)
GLUCOSE: 104 mg/dL — AB (ref 70–99)
Potassium: 3.6 mEq/L — ABNORMAL LOW (ref 3.7–5.3)
Sodium: 133 mEq/L — ABNORMAL LOW (ref 137–147)

## 2013-09-09 LAB — LEGIONELLA ANTIGEN, URINE: LEGIONELLA ANTIGEN, URINE: NEGATIVE

## 2013-09-09 LAB — GLUCOSE, CAPILLARY
GLUCOSE-CAPILLARY: 109 mg/dL — AB (ref 70–99)
GLUCOSE-CAPILLARY: 99 mg/dL (ref 70–99)
Glucose-Capillary: 172 mg/dL — ABNORMAL HIGH (ref 70–99)
Glucose-Capillary: 96 mg/dL (ref 70–99)
Glucose-Capillary: 74 mg/dL (ref 70–99)
Glucose-Capillary: 99 mg/dL (ref 70–99)
Glucose-Capillary: 97 mg/dL (ref 70–99)

## 2013-09-09 LAB — PROTIME-INR
INR: 1.62 — AB (ref 0.00–1.49)
Prothrombin Time: 18.8 seconds — ABNORMAL HIGH (ref 11.6–15.2)

## 2013-09-09 LAB — BLOOD GAS, ARTERIAL
Acid-base deficit: 3.9 mmol/L — ABNORMAL HIGH (ref 0.0–2.0)
Bicarbonate: 20.7 mEq/L (ref 20.0–24.0)
Drawn by: 23604
FIO2: 50 %
MECHVT: 400 mL
O2 Saturation: 94.9 %
PEEP: 5 cmH2O
Patient temperature: 98.6
RATE: 22 resp/min
TCO2: 21.8 mmol/L (ref 0–100)
pCO2 arterial: 37.8 mmHg (ref 35.0–45.0)
pH, Arterial: 7.357 (ref 7.350–7.450)
pO2, Arterial: 87.2 mmHg (ref 80.0–100.0)

## 2013-09-09 LAB — CBC
HCT: 37.3 % (ref 36.0–46.0)
Hemoglobin: 11.9 g/dL — ABNORMAL LOW (ref 12.0–15.0)
MCH: 29 pg (ref 26.0–34.0)
MCHC: 31.9 g/dL (ref 30.0–36.0)
MCV: 91 fL (ref 78.0–100.0)
Platelets: 121 10*3/uL — ABNORMAL LOW (ref 150–400)
RBC: 4.1 MIL/uL (ref 3.87–5.11)
RDW: 17 % — ABNORMAL HIGH (ref 11.5–15.5)
WBC: 20.1 10*3/uL — ABNORMAL HIGH (ref 4.0–10.5)

## 2013-09-09 LAB — HEPATIC FUNCTION PANEL
ALBUMIN: 2.1 g/dL — AB (ref 3.5–5.2)
ALT: 369 U/L — ABNORMAL HIGH (ref 0–35)
AST: 266 U/L — AB (ref 0–37)
Alkaline Phosphatase: 91 U/L (ref 39–117)
Bilirubin, Direct: 0.4 mg/dL — ABNORMAL HIGH (ref 0.0–0.3)
Indirect Bilirubin: 0.3 mg/dL (ref 0.3–0.9)
Total Bilirubin: 0.7 mg/dL (ref 0.3–1.2)
Total Protein: 5.2 g/dL — ABNORMAL LOW (ref 6.0–8.3)

## 2013-09-09 LAB — CARBOXYHEMOGLOBIN
Carboxyhemoglobin: 0.9 % (ref 0.5–1.5)
Methemoglobin: 1 % (ref 0.0–1.5)
O2 Saturation: 67 %
Total hemoglobin: 12 g/dL (ref 12.0–16.0)

## 2013-09-09 LAB — HEPARIN LEVEL (UNFRACTIONATED)
HEPARIN UNFRACTIONATED: 0.38 [IU]/mL (ref 0.30–0.70)
Heparin Unfractionated: 0.35 IU/mL (ref 0.30–0.70)

## 2013-09-09 LAB — TROPONIN I: Troponin I: 0.31 ng/mL (ref <0.30)

## 2013-09-09 LAB — PRO B NATRIURETIC PEPTIDE: PRO B NATRI PEPTIDE: 6702 pg/mL — AB (ref 0–125)

## 2013-09-09 LAB — LACTIC ACID, PLASMA: Lactic Acid, Venous: 1.4 mmol/L (ref 0.5–2.2)

## 2013-09-09 LAB — PHOSPHORUS: Phosphorus: 2.6 mg/dL (ref 2.3–4.6)

## 2013-09-09 LAB — MAGNESIUM: Magnesium: 1.9 mg/dL (ref 1.5–2.5)

## 2013-09-09 LAB — PROCALCITONIN: PROCALCITONIN: 1.07 ng/mL

## 2013-09-09 MED ORDER — FENTANYL CITRATE 0.05 MG/ML IJ SOLN
INTRAMUSCULAR | Status: AC
  Filled 2013-09-09: qty 4

## 2013-09-09 MED ORDER — VITAL HIGH PROTEIN PO LIQD
1000.0000 mL | ORAL | Status: DC
  Administered 2013-09-09 – 2013-09-12 (×5): 1000 mL
  Filled 2013-09-09 (×5): qty 1000

## 2013-09-09 MED ORDER — BIOTENE DRY MOUTH MT LIQD
15.0000 mL | Freq: Four times a day (QID) | OROMUCOSAL | Status: DC
  Administered 2013-09-10 – 2013-09-13 (×15): 15 mL via OROMUCOSAL

## 2013-09-09 MED ORDER — ADULT MULTIVITAMIN LIQUID CH: 5.0000 mL | Freq: Every day | ORAL | Status: DC

## 2013-09-09 MED ORDER — HEPARIN (PORCINE) IN NACL 100-0.45 UNIT/ML-% IJ SOLN
1300.0000 [IU]/h | INTRAMUSCULAR | Status: DC
  Administered 2013-09-09: 1250 [IU]/h via INTRAVENOUS
  Administered 2013-09-10: 1300 [IU]/h via INTRAVENOUS
  Administered 2013-09-10: 1250 [IU]/h via INTRAVENOUS
  Filled 2013-09-09 (×4): qty 250

## 2013-09-09 MED ORDER — MIDAZOLAM HCL 2 MG/2ML IJ SOLN
1.0000 mg | INTRAMUSCULAR | Status: DC | PRN
  Administered 2013-09-09 (×2): 1 mg via INTRAVENOUS
  Filled 2013-09-09: qty 2

## 2013-09-09 MED ORDER — MIDAZOLAM HCL 2 MG/2ML IJ SOLN
INTRAMUSCULAR | Status: AC
  Administered 2013-09-09: 1 mg
  Filled 2013-09-09: qty 2

## 2013-09-09 MED ORDER — MIDAZOLAM HCL 2 MG/2ML IJ SOLN
INTRAMUSCULAR | Status: AC
  Filled 2013-09-09: qty 4

## 2013-09-09 MED ORDER — FENTANYL CITRATE 0.05 MG/ML IJ SOLN
INTRAMUSCULAR | Status: AC | PRN
  Administered 2013-09-09: 100 ug via INTRAVENOUS

## 2013-09-09 MED ORDER — MIDAZOLAM HCL 2 MG/2ML IJ SOLN
INTRAMUSCULAR | Status: AC | PRN
  Administered 2013-09-09: 2 mg via INTRAVENOUS

## 2013-09-09 MED ORDER — SODIUM CHLORIDE 0.9 % IJ SOLN
10.0000 mL | INTRAMUSCULAR | Status: DC | PRN
  Administered 2013-09-13 – 2013-09-16 (×6): 10 mL

## 2013-09-09 MED ORDER — MIDAZOLAM HCL 2 MG/2ML IJ SOLN
1.0000 mg | INTRAMUSCULAR | Status: DC | PRN
  Administered 2013-09-09: 1 mg via INTRAVENOUS
  Filled 2013-09-09: qty 2

## 2013-09-09 MED ORDER — HYDROCODONE-ACETAMINOPHEN 5-325 MG PO TABS: 1.0000 | ORAL_TABLET | ORAL | Status: DC | PRN

## 2013-09-09 MED ORDER — ADULT MULTIVITAMIN LIQUID CH
5.0000 mL | Freq: Every day | ORAL | Status: DC
  Administered 2013-09-09 – 2013-09-12 (×4): 5 mL
  Filled 2013-09-09 (×4): qty 5

## 2013-09-09 MED ORDER — SODIUM CHLORIDE 0.9 % IJ SOLN
10.0000 mL | Freq: Two times a day (BID) | INTRAMUSCULAR | Status: DC
  Administered 2013-09-12: 10 mL

## 2013-09-09 MED ORDER — PANTOPRAZOLE SODIUM 40 MG PO PACK
40.0000 mg | PACK | Freq: Every day | ORAL | Status: DC
  Administered 2013-09-09 – 2013-09-12 (×4): 40 mg
  Filled 2013-09-09 (×5): qty 20

## 2013-09-09 MED ORDER — PRO-STAT SUGAR FREE PO LIQD
30.0000 mL | Freq: Three times a day (TID) | ORAL | Status: DC
  Administered 2013-09-09 – 2013-09-12 (×9): 30 mL
  Filled 2013-09-09 (×13): qty 30

## 2013-09-09 MED ORDER — LIDOCAINE HCL 1 % IJ SOLN
INTRAMUSCULAR | Status: AC
  Filled 2013-09-09: qty 10

## 2013-09-09 MED FILL — Phenylephrine HCl Inj 10 MG/ML: INTRAMUSCULAR | Qty: 1 | Status: AC

## 2013-09-09 MED FILL — Norepinephrine Bitartrate IV Soln 1 MG/ML (Base Equivalent): INTRAVENOUS | Qty: 4 | Status: AC

## 2013-09-09 NOTE — Procedures (Signed)
CT R chest drain 73f No complication No blood loss. See complete dictation in [facility]

## 2013-09-09 NOTE — Progress Notes (Signed)
Pharmacy consult: heparin Heparin drip has been off since 10 am for a chest tube placement.  Heparin to resume now. Will resume at previous therapeutic rate with no bolus and check HL in am Thanks Herby Abraham, Pharm.D. [phone] 09/09/2013 6:16 PM

## 2013-09-09 NOTE — H&P (Signed)
Cathy Robles is an 73 y.o. female.   Chief Complaint: Pt transferred from [facility] B Pneumonia; respiratory failure--On Vent +smoker; Atrial fib EF 20% CXR reveals loculated effusion - probable empyema Dr Elsworth Soho has requested consult for Rt chest tube drain placement Wants to avoid VATS if can--secondary to many other co morbidities Dr Vernard Gambles has reviewed films and feels pt is a candidate for Rt chest tube placement  HPI: Afib- off Heparin since 1000 am; COPD; smoker; on vent; HTN; HLD; CAD  Past Medical History  Diagnosis Date  . Atrial fibrillation with rapid ventricular response   . LBBB (left bundle branch block)     Chronic  . History of chest pain     a.  2011 Cath:  Minimal Coronary Plaque  . Hiatal hernia   . GERD (gastroesophageal reflux disease)   . Asthma   . Current tobacco use     Currently smoking 1 pack per day  . DJD (degenerative joint disease)   . Spinal stenosis     At L5-S1  . Chronic back pain   . Osteoarthritis   . Anxiety   . Hypertension   . Hyperlipidemia   . Colon polyps   . Hypothyroidism   . Fibromyalgia   . History of headache   . Cardiomyopathy 2011    a. Possibly related to Afib with RVR;  b. 12/2009 Echo: EF 20-25%, anteroseptal, apical, inferior AK. Mild MR. Mildly dilated LA.    Past Surgical History  Procedure Laterality Date  . Tonsillectomy and adenoidectomy      As a child  . Tubal ligation    . Partial hysterectomy    . Bladder tack    . Rotator cuff repair      Right  . 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 Lumbar laminectomy/decompression microdiscectomy      Lumbar decompression  . Total knee arthroplasty  2002    Left  . Cervical discectomy      Details unknown    Family History  Problem Relation Age of Onset  . Osteoarthritis Other     family history  . Colon cancer Other     family history  . Diabetes Other     family history   Social History:  reports that she has been smoking.   She does not have any smokeless tobacco history on file. She reports that she drinks alcohol. Her drug history is not on file.  Allergies:  Allergies  Allergen Reactions  . Aspirin     Pt intubated (08/2013) and unable to verbalize reaction, but affirms allergy (so does family)  . Codeine     REACTION: itch with large doses  . Iodine Itching    Felt hot  . Levofloxacin     REACTION: itch  . Meperidine Hcl     REACTION: nausea  . Methadone     REACTION: ? confusion  . Penicillins     REACTION: hives  . Sulfonamide Derivatives     REACTION: nausea,vomiting,itching    Medications Prior to Admission  Medication Sig Dispense Refill  . Aspirin-Acetaminophen-Caffeine (GOODY HEADACHE PO) Take 1 packet by mouth every 6 (six) hours.      . Cyanocobalamin (VITAMIN B-12 PO) Take 1 capsule by mouth daily.      . Fish Oil OIL Take 1 capsule by mouth daily.       Marland Kitchen VITAMIN A PO Take 1 capsule by mouth  daily.       Marland Kitchen VITAMIN E PO Take 1 capsule by mouth daily.        Results for orders placed during the hospital encounter of 09/22/2013 (from the past 48 hour(s))  CARBOXYHEMOGLOBIN     Status: Abnormal   Collection Time    09/04/2013  2:55 AM      Result Value Ref Range   Total hemoglobin 11.9 (*) 12.0 - 16.0 g/dL   O2 Saturation 70.5     Carboxyhemoglobin 0.6  0.5 - 1.5 %   Methemoglobin 1.3  0.0 - 1.5 %  CBC WITH DIFFERENTIAL     Status: Abnormal   Collection Time    08/30/2013  3:00 AM      Result Value Ref Range   WBC 22.6 (*) 4.0 - 10.5 K/uL   RBC 3.94  3.87 - 5.11 MIL/uL   Hemoglobin 11.5 (*) 12.0 - 15.0 g/dL   HCT 36.6  36.0 - 46.0 %   MCV 92.9  78.0 - 100.0 fL   MCH 29.2  26.0 - 34.0 pg   MCHC 31.4  30.0 - 36.0 g/dL   RDW 17.1 (*) 11.5 - 15.5 %   Platelets 144 (*) 150 - 400 K/uL   Neutrophils Relative % 90 (*) 43 - 77 %   Lymphocytes Relative 5 (*) 12 - 46 %   Monocytes Relative 5  3 - 12 %   Eosinophils Relative 0  0 - 5 %   Basophils Relative 0  0 - 1 %   Neutro Abs 20.4  (*) 1.7 - 7.7 K/uL   Lymphs Abs 1.1  0.7 - 4.0 K/uL   Monocytes Absolute 1.1 (*) 0.1 - 1.0 K/uL   Eosinophils Absolute 0.0  0.0 - 0.7 K/uL   Basophils Absolute 0.0  0.0 - 0.1 K/uL   RBC Morphology POLYCHROMASIA PRESENT     WBC Morphology MILD LEFT SHIFT (1-5% METAS, OCC MYELO, OCC BANDS)    COMPREHENSIVE METABOLIC PANEL     Status: Abnormal   Collection Time    09/18/2013  3:00 AM      Result Value Ref Range   Sodium 132 (*) 137 - 147 mEq/L   Potassium 4.4  3.7 - 5.3 mEq/L   Chloride 100  96 - 112 mEq/L   CO2 15 (*) 19 - 32 mEq/L   Glucose, Bld 177 (*) 70 - 99 mg/dL   BUN 44 (*) 6 - 23 mg/dL   Creatinine, Ser 0.94  0.50 - 1.10 mg/dL   Calcium 7.0 (*) 8.4 - 10.5 mg/dL   Total Protein 5.1 (*) 6.0 - 8.3 g/dL   Albumin 2.1 (*) 3.5 - 5.2 g/dL   AST 678 (*) 0 - 37 U/L   ALT 495 (*) 0 - 35 U/L   Alkaline Phosphatase 89  39 - 117 U/L   Total Bilirubin 1.2  0.3 - 1.2 mg/dL   GFR calc non Af Amer 60 (*) >90 mL/min   GFR calc Af Amer 70 (*) >90 mL/min   Comment: (NOTE)     The eGFR has been calculated using the CKD EPI equation.     This calculation has not been validated in all clinical situations.     eGFR's persistently <90 mL/min signify possible Chronic Kidney     Disease.  LACTIC ACID, PLASMA     Status: Abnormal   Collection Time    09/22/2013  3:08 AM      Result Value Ref Range  Lactic Acid, Venous 2.5 (*) 0.5 - 2.2 mmol/L  TROPONIN I     Status: None   Collection Time    09/12/2013  3:09 AM      Result Value Ref Range   Troponin I <0.30  <0.30 ng/mL   Comment:            Due to the release kinetics of cTnI,     a negative result within the first hours     of the onset of symptoms does not rule out     myocardial infarction with certainty.     If myocardial infarction is still suspected,     repeat the test at appropriate intervals.  PROCALCITONIN     Status: None   Collection Time    08/30/2013  3:09 AM      Result Value Ref Range   Procalcitonin 0.78     Comment:             Interpretation:     PCT > 0.5 ng/mL and <= 2 ng/mL:     Systemic infection (sepsis) is possible,     but other conditions are known to elevate     PCT as well.     (NOTE)             ICU PCT Algorithm               Non ICU PCT Algorithm        ----------------------------     ------------------------------             PCT < 0.25 ng/mL                 PCT < 0.1 ng/mL         Stopping of antibiotics            Stopping of antibiotics           strongly encouraged.               strongly encouraged.        ----------------------------     ------------------------------           PCT level decrease by               PCT < 0.25 ng/mL           >= 80% from peak PCT           OR PCT 0.25 - 0.5 ng/mL          Stopping of antibiotics                                                 encouraged.         Stopping of antibiotics               encouraged.        ----------------------------     ------------------------------           PCT level decrease by              PCT >= 0.25 ng/mL           < 80% from peak PCT            AND PCT >= 0.5 ng/mL            Continuing antibiotics  encouraged.           Continuing antibiotics                encouraged.        ----------------------------     ------------------------------         PCT level increase compared          PCT > 0.5 ng/mL             with peak PCT AND              PCT >= 0.5 ng/mL             Escalation of antibiotics                                              strongly encouraged.          Escalation of antibiotics            strongly encouraged.  CULTURE, BLOOD (ROUTINE X 2)     Status: None   Collection Time    08/24/2013  3:09 AM      Result Value Ref Range   Specimen Description BLOOD CENTRAL LINE     Special Requests RIGHT IJ BOTTLES DRAWN AEROBIC ONLY [place]     Culture  Setup Time       Value: 08/25/2013 14:07     Performed at [facility]   Culture       Value:         BLOOD CULTURE RECEIVED NO GROWTH TO DATE CULTURE WILL BE HELD FOR 5 DAYS BEFORE ISSUING A FINAL NEGATIVE REPORT     Performed at [facility]   Report Status PENDING    MRSA PCR SCREENING     Status: Abnormal   Collection Time    09/21/2013  3:14 AM      Result Value Ref Range   MRSA by PCR POSITIVE (*) NEGATIVE   Comment:            The GeneXpert MRSA Assay (FDA     approved for NASAL specimens     only), is one component of a     comprehensive MRSA colonization     surveillance program. It is not     intended to diagnose MRSA     infection nor to guide or     monitor treatment for     MRSA infections.     RESULT CALLED TO, READ BACK BY AND VERIFIED WITH:     PARRISH,J RN 0502 09/21/2013 MITCHELL,L  GLUCOSE, CAPILLARY     Status: Abnormal   Collection Time    09/02/2013  3:17 AM      Result Value Ref Range   Glucose-Capillary 173 (*) 70 - 99 mg/dL  TSH     Status: Abnormal   Collection Time    09/22/2013  3:43 AM      Result Value Ref Range   TSH 23.590 (*) 0.350 - 4.500 uIU/mL   Comment: Please note change in reference range.  PRO B NATRIURETIC PEPTIDE     Status: Abnormal   Collection Time    09/11/2013  3:43 AM      Result Value Ref Range   Pro B Natriuretic peptide (BNP) 10714.0 (*) 0 - 125 pg/mL  BLOOD GAS, ARTERIAL     Status: Abnormal  Collection Time    08/29/2013  3:54 AM      Result Value Ref Range   FIO2 100.00     Delivery systems VENTILATOR     Mode PRESSURE REGULATED VOLUME CONTROL     VT 400.00     Rate 18.0     Peep/cpap 5.0     pH, Arterial 7.191 (*) 7.350 - 7.450   Comment: CRITICAL RESULT CALLED TO, READ BACK BY AND VERIFIED WITH:      ROBERT MUELLER,RN AT 0400 BY FRANK MIKE JR RRT,RCP ON 09/05/2013   pCO2 arterial 47.1 (*) 35.0 - 45.0 mmHg   pO2, Arterial 125.0 (*) 80.0 - 100.0 mmHg   Bicarbonate 17.4 (*) 20.0 - 24.0 mEq/L   TCO2 18.8  0 - 100 mmol/L   Acid-base deficit 9.4 (*) 0.0 - 2.0 mmol/L   O2 Saturation 96.1     Patient temperature 98.6      Collection site A-LINE     Drawn by [phone]     Sample type ARTERIAL    CULTURE, RESPIRATORY (NON-EXPECTORATED)     Status: None   Collection Time    09/20/2013  4:00 AM      Result Value Ref Range   Specimen Description TRACHEAL ASPIRATE     Special Requests NONE     Gram Stain PENDING     Culture       Value: Culture reincubated for better growth     Performed at [facility]   Report Status PENDING    URINALYSIS, ROUTINE W REFLEX MICROSCOPIC     Status: Abnormal   Collection Time    09/09/2013  4:33 AM      Result Value Ref Range   Color, Urine AMBER (*) YELLOW   Comment: BIOCHEMICALS MAY BE AFFECTED BY COLOR   APPearance CLOUDY (*) CLEAR   Specific Gravity, Urine 1.020  1.005 - 1.030   pH 5.5  5.0 - 8.0   Glucose, UA NEGATIVE  NEGATIVE mg/dL   Hgb urine dipstick MODERATE (*) NEGATIVE   Bilirubin Urine SMALL (*) NEGATIVE   Ketones, ur NEGATIVE  NEGATIVE mg/dL   Protein, ur 30 (*) NEGATIVE mg/dL   Urobilinogen, UA 2.0 (*) 0.0 - 1.0 mg/dL   Nitrite NEGATIVE  NEGATIVE   Leukocytes, UA NEGATIVE  NEGATIVE  LEGIONELLA ANTIGEN, URINE     Status: None   Collection Time    09/07/2013  4:33 AM      Result Value Ref Range   Specimen Description URINE, CATHETERIZED     Special Requests NONE     Legionella Antigen, Urine       Value: Negative for Legionella pneumophilia serogroup 1     Performed at [facility]   Report Status 09/09/2013 FINAL    STREP PNEUMONIAE URINARY ANTIGEN     Status: Abnormal   Collection Time    08/24/2013  4:33 AM      Result Value Ref Range   Strep Pneumo Urinary Antigen POSITIVE (*) NEGATIVE  URINE MICROSCOPIC-ADD ON     Status: Abnormal   Collection Time    09/14/2013  4:33 AM      Result Value Ref Range   Squamous Epithelial / LPF FEW (*) RARE   WBC, UA 0-2  <3 WBC/hpf   RBC / HPF 3-6  <3 RBC/hpf   Bacteria, UA FEW (*) RARE   Casts HYALINE CASTS (*) NEGATIVE   Urine-Other AMORPHOUS URATES/PHOSPHATES    BASIC METABOLIC PANEL  Status: Abnormal   Collection Time    08/28/2013  5:00 AM      Result Value Ref Range   Sodium 132 (*) 137 - 147 mEq/L   Potassium 4.7  3.7 - 5.3 mEq/L   Chloride 100  96 - 112 mEq/L   CO2 17 (*) 19 - 32 mEq/L   Glucose, Bld 166 (*) 70 - 99 mg/dL   BUN 49 (*) 6 - 23 mg/dL   Creatinine, Ser 1.04  0.50 - 1.10 mg/dL   Calcium 7.5 (*) 8.4 - 10.5 mg/dL   GFR calc non Af Amer 53 (*) >90 mL/min   GFR calc Af Amer 62 (*) >90 mL/min   Comment: (NOTE)     The eGFR has been calculated using the CKD EPI equation.     This calculation has not been validated in all clinical situations.     eGFR's persistently <90 mL/min signify possible Chronic Kidney     Disease.  CBC     Status: Abnormal   Collection Time    09/20/2013  5:00 AM      Result Value Ref Range   WBC 21.6 (*) 4.0 - 10.5 K/uL   RBC 4.12  3.87 - 5.11 MIL/uL   Hemoglobin 11.8 (*) 12.0 - 15.0 g/dL   HCT 38.0  36.0 - 46.0 %   MCV 92.2  78.0 - 100.0 fL   MCH 28.6  26.0 - 34.0 pg   MCHC 31.1  30.0 - 36.0 g/dL   RDW 17.2 (*) 11.5 - 15.5 %   Platelets 128 (*) 150 - 400 K/uL  HEMOGLOBIN A1C     Status: Abnormal   Collection Time    09/06/2013  5:00 AM      Result Value Ref Range   Hemoglobin A1C 5.9 (*) <5.7 %   Comment: (NOTE)                                                                               According to the ADA Clinical Practice Recommendations for 2011, when     HbA1c is used as a screening test:      >=6.5%   Diagnostic of Diabetes Mellitus               (if abnormal result is confirmed)     5.7-6.4%   Increased risk of developing Diabetes Mellitus     References:Diagnosis and Classification of Diabetes Mellitus,Diabetes     DSKA,7681,15(BWIOM 1):S62-S69 and Standards of Medical Care in             Diabetes - 2011,Diabetes Care,2011,34 (Suppl 1):S11-S61.   Mean Plasma Glucose 123 (*) <117 mg/dL   Comment: Performed at [facility], [facility]     Status: Abnormal   Collection Time    09/02/2013  7:57 AM       Result Value Ref Range   Glucose-Capillary 136 (*) 70 - 99 mg/dL   Comment 1 Notify RN    TROPONIN I     Status: Abnormal   Collection Time    08/28/2013  9:07 AM      Result Value Ref Range   Troponin I 0.70 (*) <0.30 ng/mL  Comment:            Due to the release kinetics of cTnI,     a negative result within the first hours     of the onset of symptoms does not rule out     myocardial infarction with certainty.     If myocardial infarction is still suspected,     repeat the test at appropriate intervals.     CRITICAL RESULT CALLED TO, READ BACK BY AND VERIFIED WITH:     HARDOCK C.,RN 09/18/2013 1048 BY JONESJ  POCT I-STAT 3, ART BLOOD GAS (G3+)     Status: Abnormal   Collection Time    09/20/2013  9:46 AM      Result Value Ref Range   pH, Arterial 7.352  7.350 - 7.450   pCO2 arterial 36.9  35.0 - 45.0 mmHg   pO2, Arterial 144.0 (*) 80.0 - 100.0 mmHg   Bicarbonate 20.7  20.0 - 24.0 mEq/L   TCO2 22  0 - 100 mmol/L   O2 Saturation 99.0     Acid-base deficit 5.0 (*) 0.0 - 2.0 mmol/L   Patient temperature 96.9 F     Collection site ARTERIAL LINE     Drawn by Nurse     Sample type ARTERIAL    CULTURE, BLOOD (ROUTINE X 2)     Status: None   Collection Time    08/27/2013 10:38 AM      Result Value Ref Range   Specimen Description BLOOD RIGHT HAND     Special Requests BOTTLES DRAWN AEROBIC ONLY 3CC     Culture  Setup Time       Value: 08/26/2013 22:49     Performed at [facility]   Culture       Value:        BLOOD CULTURE RECEIVED NO GROWTH TO DATE CULTURE WILL BE HELD FOR 5 DAYS BEFORE ISSUING A FINAL NEGATIVE REPORT     Performed at [facility]   Report Status PENDING    GLUCOSE, CAPILLARY     Status: Abnormal   Collection Time    08/27/2013 11:43 AM      Result Value Ref Range   Glucose-Capillary 118 (*) 70 - 99 mg/dL   Comment 1 Notify RN    PROCALCITONIN     Status: None   Collection Time    09/04/2013  1:20 PM      Result Value Ref Range    Procalcitonin 1.34     Comment:            Interpretation:     PCT > 0.5 ng/mL and <= 2 ng/mL:     Systemic infection (sepsis) is possible,     but other conditions are known to elevate     PCT as well.     (NOTE)             ICU PCT Algorithm               Non ICU PCT Algorithm        ----------------------------     ------------------------------             PCT < 0.25 ng/mL                 PCT < 0.1 ng/mL         Stopping of antibiotics            Stopping of antibiotics  strongly encouraged.               strongly encouraged.        ----------------------------     ------------------------------           PCT level decrease by               PCT < 0.25 ng/mL           >= 80% from peak PCT           OR PCT 0.25 - 0.5 ng/mL          Stopping of antibiotics                                                 encouraged.         Stopping of antibiotics               encouraged.        ----------------------------     ------------------------------           PCT level decrease by              PCT >= 0.25 ng/mL           < 80% from peak PCT            AND PCT >= 0.5 ng/mL            Continuing antibiotics                                                  encouraged.           Continuing antibiotics                encouraged.        ----------------------------     ------------------------------         PCT level increase compared          PCT > 0.5 ng/mL             with peak PCT AND              PCT >= 0.5 ng/mL             Escalation of antibiotics                                              strongly encouraged.          Escalation of antibiotics            strongly encouraged.  HEPARIN LEVEL (UNFRACTIONATED)     Status: Abnormal   Collection Time    09/06/2013  2:00 PM      Result Value Ref Range   Heparin Unfractionated 0.20 (*) 0.30 - 0.70 IU/mL   Comment:            IF HEPARIN RESULTS ARE BELOW     EXPECTED VALUES, AND PATIENT     DOSAGE HAS BEEN CONFIRMED,      SUGGEST FOLLOW UP TESTING     OF ANTITHROMBIN III LEVELS.  BASIC METABOLIC  PANEL     Status: Abnormal   Collection Time    09/18/2013  2:35 PM      Result Value Ref Range   Sodium 134 (*) 137 - 147 mEq/L   Potassium 4.1  3.7 - 5.3 mEq/L   Chloride 99  96 - 112 mEq/L   CO2 19  19 - 32 mEq/L   Glucose, Bld 120 (*) 70 - 99 mg/dL   BUN 50 (*) 6 - 23 mg/dL   Creatinine, Ser 1.07  0.50 - 1.10 mg/dL   Calcium 7.7 (*) 8.4 - 10.5 mg/dL   GFR calc non Af Amer 51 (*) >90 mL/min   GFR calc Af Amer 60 (*) >90 mL/min   Comment: (NOTE)     The eGFR has been calculated using the CKD EPI equation.     This calculation has not been validated in all clinical situations.     eGFR's persistently <90 mL/min signify possible Chronic Kidney     Disease.  GLUCOSE, CAPILLARY     Status: None   Collection Time    08/27/2013  3:37 PM      Result Value Ref Range   Glucose-Capillary 94  70 - 99 mg/dL  TROPONIN I     Status: Abnormal   Collection Time    09/15/2013  7:40 PM      Result Value Ref Range   Troponin I 0.44 (*) <0.30 ng/mL   Comment:            Due to the release kinetics of cTnI,     a negative result within the first hours     of the onset of symptoms does not rule out     myocardial infarction with certainty.     If myocardial infarction is still suspected,     repeat the test at appropriate intervals.     CRITICAL VALUE NOTED.  VALUE IS CONSISTENT WITH PREVIOUSLY REPORTED AND CALLED VALUE.  GLUCOSE, CAPILLARY     Status: None   Collection Time    08/26/2013  7:42 PM      Result Value Ref Range   Glucose-Capillary 91  70 - 99 mg/dL   Comment 1 Documented in Chart     Comment 2 Notify RN    GLUCOSE, CAPILLARY     Status: Abnormal   Collection Time    09/09/13 12:12 AM      Result Value Ref Range   Glucose-Capillary 109 (*) 70 - 99 mg/dL   Comment 1 Documented in Chart     Comment 2 Notify RN    HEPARIN LEVEL (UNFRACTIONATED)     Status: None   Collection Time    09/09/13 12:35 AM       Result Value Ref Range   Heparin Unfractionated 0.35  0.30 - 0.70 IU/mL   Comment:            IF HEPARIN RESULTS ARE BELOW     EXPECTED VALUES, AND PATIENT     DOSAGE HAS BEEN CONFIRMED,     SUGGEST FOLLOW UP TESTING     OF ANTITHROMBIN III LEVELS.  BASIC METABOLIC PANEL     Status: Abnormal   Collection Time    09/09/13  4:00 AM      Result Value Ref Range   Sodium 133 (*) 137 - 147 mEq/L   Potassium 3.6 (*) 3.7 - 5.3 mEq/L   Chloride 97  96 - 112 mEq/L   CO2 20  19 -  32 mEq/L   Glucose, Bld 104 (*) 70 - 99 mg/dL   BUN 42 (*) 6 - 23 mg/dL   Creatinine, Ser 0.93  0.50 - 1.10 mg/dL   Calcium 7.8 (*) 8.4 - 10.5 mg/dL   GFR calc non Af Amer 61 (*) >90 mL/min   GFR calc Af Amer 71 (*) >90 mL/min   Comment: (NOTE)     The eGFR has been calculated using the CKD EPI equation.     This calculation has not been validated in all clinical situations.     eGFR's persistently <90 mL/min signify possible Chronic Kidney     Disease.  CBC     Status: Abnormal   Collection Time    09/09/13  4:00 AM      Result Value Ref Range   WBC 20.1 (*) 4.0 - 10.5 K/uL   RBC 4.10  3.87 - 5.11 MIL/uL   Hemoglobin 11.9 (*) 12.0 - 15.0 g/dL   HCT 37.3  36.0 - 46.0 %   MCV 91.0  78.0 - 100.0 fL   MCH 29.0  26.0 - 34.0 pg   MCHC 31.9  30.0 - 36.0 g/dL   RDW 17.0 (*) 11.5 - 15.5 %   Platelets 121 (*) 150 - 400 K/uL  MAGNESIUM     Status: None   Collection Time    09/09/13  4:00 AM      Result Value Ref Range   Magnesium 1.9  1.5 - 2.5 mg/dL  PHOSPHORUS     Status: None   Collection Time    09/09/13  4:00 AM      Result Value Ref Range   Phosphorus 2.6  2.3 - 4.6 mg/dL  TROPONIN I     Status: Abnormal   Collection Time    09/09/13  4:00 AM      Result Value Ref Range   Troponin I 0.31 (*) <0.30 ng/mL   Comment:            Due to the release kinetics of cTnI,     a negative result within the first hours     of the onset of symptoms does not rule out     myocardial infarction with certainty.      If myocardial infarction is still suspected,     repeat the test at appropriate intervals.     CRITICAL VALUE NOTED.  VALUE IS CONSISTENT WITH PREVIOUSLY REPORTED AND CALLED VALUE.  PRO B NATRIURETIC PEPTIDE     Status: Abnormal   Collection Time    09/09/13  4:00 AM      Result Value Ref Range   Pro B Natriuretic peptide (BNP) 6702.0 (*) 0 - 125 pg/mL  PROTIME-INR     Status: Abnormal   Collection Time    09/09/13  4:00 AM      Result Value Ref Range   Prothrombin Time 18.8 (*) 11.6 - 15.2 seconds   INR 1.62 (*) 0.00 - 1.49  LACTIC ACID, PLASMA     Status: None   Collection Time    09/09/13  4:00 AM      Result Value Ref Range   Lactic Acid, Venous 1.4  0.5 - 2.2 mmol/L  HEPATIC FUNCTION PANEL     Status: Abnormal   Collection Time    09/09/13  4:00 AM      Result Value Ref Range   Total Protein 5.2 (*) 6.0 - 8.3 g/dL   Albumin 2.1 (*) 3.5 - 5.2  g/dL   AST 266 (*) 0 - 37 U/L   ALT 369 (*) 0 - 35 U/L   Alkaline Phosphatase 91  39 - 117 U/L   Total Bilirubin 0.7  0.3 - 1.2 mg/dL   Bilirubin, Direct 0.4 (*) 0.0 - 0.3 mg/dL   Indirect Bilirubin 0.3  0.3 - 0.9 mg/dL  HEPARIN LEVEL (UNFRACTIONATED)     Status: None   Collection Time    09/09/13  4:00 AM      Result Value Ref Range   Heparin Unfractionated 0.38  0.30 - 0.70 IU/mL   Comment:            IF HEPARIN RESULTS ARE BELOW     EXPECTED VALUES, AND PATIENT     DOSAGE HAS BEEN CONFIRMED,     SUGGEST FOLLOW UP TESTING     OF ANTITHROMBIN III LEVELS.  PROCALCITONIN     Status: None   Collection Time    09/09/13  4:00 AM      Result Value Ref Range   Procalcitonin 1.07     Comment:            Interpretation:     PCT > 0.5 ng/mL and <= 2 ng/mL:     Systemic infection (sepsis) is possible,     but other conditions are known to elevate     PCT as well.     (NOTE)             ICU PCT Algorithm               Non ICU PCT Algorithm        ----------------------------     ------------------------------             PCT <  0.25 ng/mL                 PCT < 0.1 ng/mL         Stopping of antibiotics            Stopping of antibiotics           strongly encouraged.               strongly encouraged.        ----------------------------     ------------------------------           PCT level decrease by               PCT < 0.25 ng/mL           >= 80% from peak PCT           OR PCT 0.25 - 0.5 ng/mL          Stopping of antibiotics                                                 encouraged.         Stopping of antibiotics               encouraged.        ----------------------------     ------------------------------           PCT level decrease by              PCT >= 0.25 ng/mL           < 80% from peak  PCT            AND PCT >= 0.5 ng/mL            Continuing antibiotics                                                  encouraged.           Continuing antibiotics                encouraged.        ----------------------------     ------------------------------         PCT level increase compared          PCT > 0.5 ng/mL             with peak PCT AND              PCT >= 0.5 ng/mL             Escalation of antibiotics                                              strongly encouraged.          Escalation of antibiotics            strongly encouraged.  CARBOXYHEMOGLOBIN     Status: None   Collection Time    09/09/13  4:01 AM      Result Value Ref Range   Total hemoglobin 12.0  12.0 - 16.0 g/dL   O2 Saturation 67.0     Carboxyhemoglobin 0.9  0.5 - 1.5 %   Methemoglobin 1.0  0.0 - 1.5 %  BLOOD GAS, ARTERIAL     Status: Abnormal   Collection Time    09/09/13  4:01 AM      Result Value Ref Range   FIO2 50.00     Delivery systems VENTILATOR     Mode PRESSURE REGULATED VOLUME CONTROL     VT 400     Rate 22     Peep/cpap 5.0     pH, Arterial 7.357  7.350 - 7.450   pCO2 arterial 37.8  35.0 - 45.0 mmHg   pO2, Arterial 87.2  80.0 - 100.0 mmHg   Bicarbonate 20.7  20.0 - 24.0 mEq/L   TCO2 21.8  0 - 100 mmol/L    Acid-base deficit 3.9 (*) 0.0 - 2.0 mmol/L   O2 Saturation 94.9     Patient temperature 98.6     Collection site LEFT RADIAL     Drawn by [id number]     Sample type ARTERIAL     Allens test (pass/fail) PASS  PASS  GLUCOSE, CAPILLARY     Status: None   Collection Time    09/09/13  4:10 AM      Result Value Ref Range   Glucose-Capillary 96  70 - 99 mg/dL   Comment 1 Documented in Chart     Comment 2 Notify RN    GLUCOSE, CAPILLARY     Status: None   Collection Time    09/09/13  7:51 AM      Result Value Ref Range   Glucose-Capillary 74  70 - 99 mg/dL  GLUCOSE, CAPILLARY  Status: None   Collection Time    09/09/13 12:05 PM      Result Value Ref Range   Glucose-Capillary 99  70 - 99 mg/dL   Ct Chest Wo Contrast  09/14/2013   CLINICAL DATA:  Evaluate empyema.  EXAM: CT CHEST WITHOUT CONTRAST  TECHNIQUE: Multidetector CT imaging of the chest was performed following the standard protocol without IV contrast.  COMPARISON:  08/27/2013  FINDINGS: The heart size is moderately enlarged. There is no pericardial effusion. Calcified atherosclerotic disease involves the thoracic aorta as well as the LAD and RCA coronary arteries. Prominent mediastinal lymph nodes are identified in may be reactive. There is no axillary or supraclavicular adenopathy.  Moderate volume left pleural effusion is identified. A moderate volume of loculated right pleural effusion is also identified. This may represent an empyema. Dense airspace consolidation containing air bronchograms is identified within both lower lobes as well as the right middle lobe. Patchy areas of ground-glass attenuation and airspace consolidation is identified within both upper lobes.  A small amount of perihepatic ascites is noted in the upper abdomen.  IMPRESSION: 1. Moderate volume, loculated right pleural effusion which may represent empyema. 2. Moderate simple appearing left pleural effusion. 3. Bilateral lower lobe airspace consolidation and  atelectasis with patchy areas of pneumonitis within both upper lobes. Findings are compatible with multifocal pneumonia. 4. Atherosclerotic disease including multi vessel coronary artery calcifications. 5. Ascites.   Electronically Signed   By: Kerby Moors M.D.   On: 09/04/2013 16:42   Dg Chest Port 1 View  09/09/2013   CLINICAL DATA:  Pneumonia  EXAM: PORTABLE CHEST - 1 VIEW  COMPARISON:  CT CHEST W/O CM dated 09/17/2013; DG CHEST 1V PORT dated 09/03/2013; DG CHEST 1V PORT dated 08/25/2013  FINDINGS: Grossly unchanged enlarged cardiac silhouette and mediastinal contours given decreased lung volumes and patient rotation to the left. Stable position of support apparatus. The pulmonary vasculature remains indistinct with cephalization lobe. Grossly unchanged small bilateral effusions with associated bilateral mid and lower lung heterogeneous/consolidative opacities. Unchanged bones.  IMPRESSION: 1.  Stable positioning of support apparatus.  No pneumothorax. 2. Grossly unchanged findings of pulmonary edema, small bilateral effusions and associated bibasilar mid and lower lung opacities worrisome for multifocal infection as demonstrated on recent chest CT.   Electronically Signed   By: Sandi Mariscal M.D.   On: 09/09/2013 08:02   Dg Chest Port 1 View  09/20/2013   CLINICAL DATA:  Endotracheal tube advanced 2 cm.  EXAM: PORTABLE CHEST - 1 VIEW  COMPARISON:  Earlier today.  FINDINGS: The endotracheal tube is in satisfactory position and slightly lower than previously demonstrated. The remainder to of the examination is unchanged.  IMPRESSION: No significant change in probable bilateral pneumonia, pleural effusions and cardiomegaly.   Electronically Signed   By: Enrique Sack M.D.   On: 09/04/2013 03:38   Dg Chest Port 1 View  09/21/2013   CLINICAL DATA:  Line placement.  EXAM: PORTABLE CHEST - 1 VIEW  COMPARISON:  12/30/2009.  FINDINGS: Dense patchy airspace opacity in both mid and lower lung zones. Probable bilateral  pleural effusions. Endotracheal tube in satisfactory position. Right jugular catheter tip in the inferior aspect of the superior vena cava. No pneumothorax. Nasogastric tube extending into the stomach. Probable enlarged cardiac silhouette, difficult to assess due to the airspace opacity. Diffuse osteopenia. Stable surgical absence or distortion of the distal clavicles.  IMPRESSION: 1. Dense bilateral airspace opacity, most compatible with pneumonia. 2. Probable bilateral pleural effusions.  3. Probable cardiomegaly.   Electronically Signed   By: Enrique Sack M.D.   On: 09/19/2013 03:33    Review of Systems  Constitutional: Negative for fever.  Respiratory: Positive for cough, sputum production, shortness of breath and wheezing.   Gastrointestinal: Negative for nausea and vomiting.  Neurological: Positive for weakness.  Psychiatric/Behavioral: Positive for substance abuse.    Blood pressure 92/36, pulse 64, temperature 98 F (36.7 C), temperature source Oral, resp. rate 22, height 5' (1.524 m), weight 105.9 kg (233 lb 7.5 oz), SpO2 100.00%. Physical Exam  Constitutional:  obese  Cardiovascular: Normal rate and regular rhythm.   Respiratory: She is in respiratory distress.  On vent  GI: Soft. Bowel sounds are normal.  Neurological:  Tries to communicate; nods yes to knowing of procedure today  Skin: Skin is warm.  Psychiatric:  Consent with husband     Assessment/Plan B Pna; resp failure- on vent Rt empyema Request for chest tube drain placement Pt scheduled for same today Off Hep now since 1000 am pts husband and dtr aware of procedure benefits and risks and agreeable to p roceed Consent signed and in chart   Lavonia Drafts 09/09/2013, 1:24 PM

## 2013-09-09 NOTE — Progress Notes (Signed)
VASCULAR LAB PRELIMINARY  PRELIMINARY  PRELIMINARY  PRELIMINARY  Bilateral upper extremity venous Dopplers completed.    Preliminary report:  There is no DVT noted in the bilateral upper extremities.  There is superficial thrombosis noted in the left basilic vein.   Kern [place], RVT 09/09/2013, 1:29 PM

## 2013-09-09 NOTE — Consult Note (Signed)
WOC wound follow up Wound type: intertriginous skin damage from moisture Wound bed: candida overgrowth with IAD inframammary, bilateral groins, and under the pannus  Drainage (amount, consistency, odor) minimal Periwound: intact, some satellite lesions noted right and left groin  Dressing procedure/placement/frequent: antimicrobial wicking fabric for the skin folds to assist with treatment of the IAD and lessening of the moisture. Interdry Ag+ ordered, bedside nurse aware to place when this arrives to unit.   Discussed POC with patient and bedside nurse.  Re consult if needed, will not follow at this time. Thanks  Jodeci Rini [employer], CWOCN [phone])

## 2013-09-09 NOTE — Progress Notes (Signed)
INITIAL NUTRITION ASSESSMENT  DOCUMENTATION CODES Per approved criteria  -Morbid Obesity   INTERVENTION:  Initiate Vital HP formula at 15 ml/hr and increase by 10 ml every 4 hours to goal rate of 35 ml/hr with Prostat liquid protein 30 ml TID to provide 1140 kcals (66% of estimated kcal needs), 118 gm protein (100% of estimated protein needs), 702 ml of free water Liquid MVI daily via tube RD to follow for nutrition care plan  NUTRITION DIAGNOSIS: Inadequate oral intake related to inability to eat as evidenced by NPO status   Goal: Enteral nutrition to provide 60-70% of estimated calorie needs (22-25 kcals/kg ideal body weight) and 100% of estimated protein needs, based on ASPEN guidelines for permissive underfeeding in critically ill obese individuals  Monitor:  TF regimen & tolerance, respiratory status, weight, labs, I/O's  Reason for Assessment: Consult, Low Braden  73 y.o. 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   Patient is currently intubated on ventilator support -- NGT in place MV: 8.9 L/min Temp (24hrs), Avg:97.6 F (36.4 C), Min:96.3 F (35.7 C), Max:98.9 F (37.2 C)   Low braden score places patient at risk for further skin breakdown.  RD consulted for TF initiation & management.  Height: Ht Readings from Last 1 Encounters:  09/18/2013 5' (1.524 m)    Weight: Wt Readings from Last 1 Encounters:  09/09/13 233 lb 7.5 oz (105.9 kg)    Ideal Body Weight: 100 lb  % Ideal Body Weight: 233%  Wt Readings from Last 10 Encounters:  09/09/13 233 lb 7.5 oz (105.9 kg)  03/23/11 215 lb (97.523 kg)  06/02/10 218 lb (98.884 kg)  02/17/10 204 lb (92.534 kg)  01/20/10 202 lb (91.627 kg)    Usual Body Weight: unable to  obtain  % Usual Body Weight: ---  BMI:  Body mass index is 45.6 kg/(m^2).  Estimated Nutritional Needs: Kcal: 1707 Protein: 115-125 gm Fluid: per MD  Skin: Stage II pressure ulcer to buttocks   Diet Order: NPO  EDUCATION NEEDS: -No education needs identified at this time   Intake/Output Summary (Last 24 hours) at 09/09/13 1111 Last data filed at 09/09/13 0811  Gross per 24 hour  Intake 1600.13 ml  Output   1155 ml  Net 445.13 ml    Labs:   Recent Labs Lab 09/06/2013 0500 09/03/2013 1435 09/09/13 0400  NA 132* 134* 133*  K 4.7 4.1 3.6*  CL 100 99 97  CO2 17* 19 20  BUN 49* 50* 42*  CREATININE 1.04 1.07 0.93  CALCIUM 7.5* 7.7* 7.8*  MG  --   --  1.9  PHOS  --   --  2.6  GLUCOSE 166* 120* 104*    CBG (last 3)   Recent Labs  09/09/13 0012 09/09/13 0410 09/09/13 0751  GLUCAP 109* 96 74    Scheduled Meds: . antiseptic oral rinse  15 mL Mouth Rinse q12n4p  . cefTRIAXone (ROCEPHIN)  IV  2 g Intravenous Q24H  . chlorhexidine  15 mL Mouth Rinse BID  . insulin aspart  2-6 Units Subcutaneous 6 times per day  . levothyroxine  150 mcg Oral QAC breakfast  . nystatin cream   Topical TID  . pantoprazole sodium  40 mg Per Tube Q1200  . pneumococcal 23  valent vaccine  0.5 mL Intramuscular Tomorrow-1000  . vancomycin  750 mg Intravenous Q12H    Continuous Infusions: . sodium chloride 10 mL/hr at 09/05/2013 0629  . amiodarone 30 mg/hr (09/09/13 0800)  . fentaNYL infusion INTRAVENOUS 100 mcg/hr (09/09/13 0800)  . heparin 1,250 Units/hr (09/09/13 0800)  . norepinephrine (LEVOPHED) Adult infusion 6 mcg/min (09/09/13 0800)    Past Medical History  Diagnosis Date  . Atrial fibrillation with rapid ventricular response   . LBBB (left bundle branch block)     Chronic  . History of chest pain     a.  2011 Cath:  Minimal Coronary Plaque  . Hiatal hernia   . GERD (gastroesophageal reflux disease)   . Asthma   . Current tobacco use     Currently smoking 1 pack per day   . DJD (degenerative joint disease)   . Spinal stenosis     At L5-S1  . Chronic back pain   . Osteoarthritis   . Anxiety   . Hypertension   . Hyperlipidemia   . Colon polyps   . Hypothyroidism   . Fibromyalgia   . History of headache   . Cardiomyopathy 2011    a. Possibly related to Afib with RVR;  b. 12/2009 Echo: EF 20-25%, anteroseptal, apical, inferior AK. Mild MR. Mildly dilated LA.    Past Surgical History  Procedure Laterality Date  . Tonsillectomy and adenoidectomy      As a child  . Tubal ligation    . Partial hysterectomy    . Bladder tack    . Rotator cuff repair      Right  . 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 Lumbar laminectomy/decompression microdiscectomy      Lumbar decompression  . Total knee arthroplasty  2002    Left  . Cervical discectomy      Details unknown    Maureen Chatters, RD, LDN Pager #: [phone] After-Hours Pager #: [phone]

## 2013-09-09 NOTE — Progress Notes (Signed)
Pharmacy Consult Note  Pharmacy Consult for Heparin Indication: atrial fibrillation  Patient Measurements: Height: 5' (152.4 cm) Weight: 233 lb 7.5 oz (105.9 kg) IBW/kg (Calculated) : 45.5 Dosing Weight: 70 kg  Vital Signs: Temp: 97.9 F (36.6 C) (05/18 0746) Temp src: Oral (05/18 0746) BP: 88/68 mmHg (05/18 0800) Pulse Rate: 67 (05/18 0800)  Labs:  Recent Labs  Sep 12, 2013 0300  2013/09/12 0500 09/12/2013 0907 Sep 12, 2013 1400 12-Sep-2013 1435 09-12-2013 1940 09/09/13 0035 09/09/13 0400  HGB 11.5*  --  11.8*  --   --   --   --   --  11.9*  HCT 36.6  --  38.0  --   --   --   --   --  37.3  PLT 144*  --  128*  --   --   --   --   --  121*  LABPROT  --   --   --   --   --   --   --   --  18.8*  INR  --   --   --   --   --   --   --   --  1.62*  HEPARINUNFRC  --   --   --   --  0.20*  --   --  0.35 0.38  CREATININE 0.94  --  1.04  --   --  1.07  --   --  0.93  TROPONINI  --   < >  --  0.70*  --   --  0.44*  --  0.31*  < > = values in this interval not displayed.  Estimated Creatinine Clearance: 61.9 ml/min (by C-G formula based on Cr of 0.93).  Assessment: 73 yo female with Afib for heparin Heparin level remains therapeutic  Goal of Therapy:  Heparin level 0.3-0.7 units/ml Monitor platelets by anticoagulation protocol: Yes   Plan:  Continue Heparin at current rate Follow-up am labs.  Thank you. Okey Regal, PharmD [phone]

## 2013-09-09 NOTE — Progress Notes (Signed)
Patient: Cathy Robles / Admit Date: 08/26/2013 / Date of Encounter: 09/09/2013, 9:31 AM  Subjective  Daughter says her color is better and she was awake, alert, acknowledging her daughter appropriately this AM. Sleepy at present but arousable --> interactive when awake.  Objective   Telemetry: NSR, brief paroxysms of rapid rate (similar morphology to sinus)  Physical Exam: Blood pressure 88/68, pulse 67, temperature 97.9 F (36.6 C), temperature source Oral, resp. rate 15, height 5' (1.524 m), weight 233 lb 7.5 oz (105.9 kg), SpO2 100.00%. General: Well developed obese WF in no acute distress Head: Normocephalic, atraumatic, sclera non-icteric, no xanthomas, nares are without discharge. Neck: JVP not elevated. Lungs: Intubated. Coarse BS bilaterally. Breathing is unlabored. Heart: Reg rhythm, controlled rate, S1 S2 without murmurs, rubs, or gallops.  Abdomen: Soft, non-tender, non-distended with normoactive bowel sounds. No rebound/guarding. Extremities: No clubbing or cyanosis. Tr edema. Distal pedal pulses are 2+ and equal bilaterally. Neuro: Arouses to sound of voice and follows simple commands.   Intake/Output Summary (Last 24 hours) at 09/09/13 0931 Last data filed at 09/09/13 0811  Gross per 24 hour  Intake 1692.98 ml  Output   1155 ml  Net 537.98 ml    Inpatient Medications:  . antiseptic oral rinse  15 mL Mouth Rinse q12n4p  . cefTRIAXone (ROCEPHIN)  IV  2 g Intravenous Q24H  . chlorhexidine  15 mL Mouth Rinse BID  . insulin aspart  2-6 Units Subcutaneous 6 times per day  . levothyroxine  150 mcg Oral QAC breakfast  . nystatin cream   Topical TID  . pantoprazole (PROTONIX) IV  40 mg Intravenous Daily  . pneumococcal 23 valent vaccine  0.5 mL Intramuscular Tomorrow-1000  . vancomycin  750 mg Intravenous Q12H   Infusions:  . sodium chloride 10 mL/hr at 09/15/2013 0629  . amiodarone 30 mg/hr (09/09/13 0800)  . fentaNYL infusion INTRAVENOUS 100 mcg/hr (09/09/13 0800)    . heparin 1,250 Units/hr (09/09/13 0800)  . norepinephrine (LEVOPHED) Adult infusion 6 mcg/min (09/09/13 0800)    Labs:  Recent Labs  08/30/2013 1435 09/09/13 0400  NA 134* 133*  K 4.1 3.6*  CL 99 97  CO2 19 20  GLUCOSE 120* 104*  BUN 50* 42*  CREATININE 1.07 0.93  CALCIUM 7.7* 7.8*  MG  --  1.9  PHOS  --  2.6    Recent Labs  09/02/2013 0300 09/09/13 0400  AST 678* 266*  ALT 495* 369*  ALKPHOS 89 91  BILITOT 1.2 0.7  PROT 5.1* 5.2*  ALBUMIN 2.1* 2.1*    Recent Labs  09/02/2013 0300 08/29/2013 0500 09/09/13 0400  WBC 22.6* 21.6* 20.1*  NEUTROABS 20.4*  --   --   HGB 11.5* 11.8* 11.9*  HCT 36.6 38.0 37.3  MCV 92.9 92.2 91.0  PLT 144* 128* 121*    Recent Labs  09/06/2013 0309 09/11/2013 0907 09/20/2013 1940 09/09/13 0400  TROPONINI <0.30 0.70* 0.44* 0.31*   No components found with this basename: POCBNP,   Recent Labs  09/12/2013 0500  HGBA1C 5.9*     Radiology/Studies:  Ct Chest Wo Contrast  09/04/2013   CLINICAL DATA:  Evaluate empyema.  EXAM: CT CHEST WITHOUT CONTRAST  TECHNIQUE: Multidetector CT imaging of the chest was performed following the standard protocol without IV contrast.  COMPARISON:  09/07/2013  FINDINGS: The heart size is moderately enlarged. There is no pericardial effusion. Calcified atherosclerotic disease involves the thoracic aorta as well as the LAD and RCA coronary arteries. Prominent mediastinal lymph  nodes are identified in may be reactive. There is no axillary or supraclavicular adenopathy.  Moderate volume left pleural effusion is identified. A moderate volume of loculated right pleural effusion is also identified. This may represent an empyema. Dense airspace consolidation containing air bronchograms is identified within both lower lobes as well as the right middle lobe. Patchy areas of ground-glass attenuation and airspace consolidation is identified within both upper lobes.  A small amount of perihepatic ascites is noted in the upper  abdomen.  IMPRESSION: 1. Moderate volume, loculated right pleural effusion which may represent empyema. 2. Moderate simple appearing left pleural effusion. 3. Bilateral lower lobe airspace consolidation and atelectasis with patchy areas of pneumonitis within both upper lobes. Findings are compatible with multifocal pneumonia. 4. Atherosclerotic disease including multi vessel coronary artery calcifications. 5. Ascites.   Electronically Signed   By: Signa Kellaylor  Stroud M.D.   On: 09/11/2013 16:42   Dg Chest Port 1 View  09/09/2013   CLINICAL DATA:  Pneumonia  EXAM: PORTABLE CHEST - 1 VIEW  COMPARISON:  CT CHEST W/O CM dated 09/17/2013; DG CHEST 1V PORT dated 09/17/2013; DG CHEST 1V PORT dated 08/25/2013  FINDINGS: Grossly unchanged enlarged cardiac silhouette and mediastinal contours given decreased lung volumes and patient rotation to the left. Stable position of support apparatus. The pulmonary vasculature remains indistinct with cephalization lobe. Grossly unchanged small bilateral effusions with associated bilateral mid and lower lung heterogeneous/consolidative opacities. Unchanged bones.  IMPRESSION: 1.  Stable positioning of support apparatus.  No pneumothorax. 2. Grossly unchanged findings of pulmonary edema, small bilateral effusions and associated bibasilar mid and lower lung opacities worrisome for multifocal infection as demonstrated on recent chest CT.   Electronically Signed   By: Simonne ComeJohn  Watts M.D.   On: 09/09/2013 08:02   Dg Chest Port 1 View  08/23/2013   CLINICAL DATA:  Endotracheal tube advanced 2 cm.  EXAM: PORTABLE CHEST - 1 VIEW  COMPARISON:  Earlier today.  FINDINGS: The endotracheal tube is in satisfactory position and slightly lower than previously demonstrated. The remainder to of the examination is unchanged.  IMPRESSION: No significant change in probable bilateral pneumonia, pleural effusions and cardiomegaly.   Electronically Signed   By: Gordan PaymentSteve  Reid M.D.   On: 08/31/2013 03:38   Dg Chest  Port 1 View  09/07/2013   CLINICAL DATA:  Line placement.  EXAM: PORTABLE CHEST - 1 VIEW  COMPARISON:  12/30/2009.  FINDINGS: Dense patchy airspace opacity in both mid and lower lung zones. Probable bilateral pleural effusions. Endotracheal tube in satisfactory position. Right jugular catheter tip in the inferior aspect of the superior vena cava. No pneumothorax. Nasogastric tube extending into the stomach. Probable enlarged cardiac silhouette, difficult to assess due to the airspace opacity. Diffuse osteopenia. Stable surgical absence or distortion of the distal clavicles.  IMPRESSION: 1. Dense bilateral airspace opacity, most compatible with pneumonia. 2. Probable bilateral pleural effusions. 3. Probable cardiomegaly.   Electronically Signed   By: Gordan PaymentSteve  Reid M.D.   On: 08/25/2013 03:33     Assessment and Plan  73 y/o F with history of medical noncompliance (meds/appts for 2-3 yrs), NICM, PAF, minimal plaque by cath 2011, ongoing tobacco abuse [facility] with SBP 60s, acute respiratory failure and rapid AF s/p emergent DCCV then [facility].   1. Acute ventilator-dependent respiratory failure due to CAP 2. Septic shock 3. Bilateral pneumococcal CAP 4. NSTEMI likely demand ischemia, cath 2011: minimal plaque 5. Nonischemic cardiomyopathy 15-20% 6. Paroxysmal atrial  fibillration with RVR s/p emergent DCCV at [facility] 2013/10/02 7. Medical noncompliance (discharged from practice due to habitual nonattendance) 8. Hypothyroidism, noncompliant with thyroid replacement, TSH 23 9. Chronic LBBB 10. Ongoing tobacco use 11. Elevated PT/INR ? Auto-anticoagulation 12. Transaminitis ? Shock liver 13. Morbid obesity Body mass index is 45.6 kg/(m^2).  Currently maintaining NSR on amiodarone gtt @ 30mg /hr but BP remains tenuous requiring levophed. Unable to titrate CHF meds due to hypotension. Predominant problem felt to be sepsis/PNA - on abx per PCCM. May need to trend  INR given concomitant heparin use for AF and possible shock liver. LFTs slightly improved today. Will discuss continued amio use with MD in setting of ?LFTs - these seem to be improving. 2D Echo pending. Pt confirms she is allergic to aspirin but is unable to convey rxn. Prognosis guarded.  Signed, Ronie Spies PA-C  I have examined the patient and reviewed assessment and plan and discussed with patient.  Agree with above as stated.  OK to continue amiodarone.  Important to maintain NSR for her given prior severe LV dysfunction.  LFTs improving.  I suspect LFT abnormality is from hypotension. Agree that her increased troponin is likely demand ischemia rather than plaque rupture MI.  ECG with LBBB.  Corky Crafts

## 2013-09-09 NOTE — Progress Notes (Signed)
PULMONARY  / CRITICAL CARE MEDICINE  Name: Cathy CofferBarbara K Robles MRN: [id number] DOB: 11/25/1940 PCP Bennie PieriniMARTIN,Cathy MARGARET, FNP   ADMISSION DATE:  08/25/2013 LOS 1 days  [facility] :  [facility] PRIMARY SERVICE: CCM  CHIEF COMPLAINT:  PNA/Respiratory failure  BRIEF PATIENT DESCRIPTION:    73 y/o obese smoker  with PAF, CHF due to NICM EF 20%, LBBB, chronic pain syndrome, hypothyroidism and COPD with ongoing tobacco use [facility]2013 for respiratory failure due to bilateral PNA.   Admitted in 2011 with respiratory failure and CHF in setting of AF with RVR. Echo Ef 25%. Underwent cath. Normal coronary arteries EF 40%. Underwent DC-CV. Placed on amio and Pradaxa. Last saw Dr. Antoine PocheHochrein in 2012 and was in NSR, dc'd from cardiology due to repeated no-shows.  According to her family she lives in her rcliner and only moves to get up to go to bedside commode. Has chronic pain and takes [employer] frequently but not taking any of her other meds. Has not seen a doctor in years. Continues to smoke 1-1.5 ppd. Has chronic cough. Over past week cough much worse and more dyspneic. Has had wife sputum and occasional blood tinged sputum. Occasion CP but unchanged. Daughter came to visit today and was severely dyspneic and called 911. [facility] to [facility]. Was hypotensive (SBP 60s) and in extreme respiratory distress as well as AF with RVR.  Intubated in ER. CXR with bilateral infiltrates PNA vs CHF. Treated with Vancomycin and Levofloxacin. Underwent emergent DC-CV of AF. Started on neo without much benefit. Levophed added in ambulance.   On arrival, awake and following commands. Central line placed with co-ox 71%. Bedside echo LVEF 15-20%. RV normal.   Labs from [facility]. WBC 23K. BNP 1110. Trop normal. UA negative  LINES / TUBES: RIJ CVL 08/31/2013 >>> R radial arterial line 09/09/2013>>> ETT 08/23/2013 [facility] >>>  CULTURES: BCX x 2 (08/29/2013) >>> Sputum CX  (Pending) >> UA (09/01/2013 >> [facility] ....... Urine strep - POSITIVE Urine leg resp virus pcr tracheal aspirate   ANTIBIOTICS: Vancomycin 09/21/2013 >>> Cefipime 08/29/13 >>>5/17, CEftiraxone 5/17 >>  Levofloxacin [facility] 1 [facility] 08/29/13 >>> d/c'd due to reported allergy  PCN alllergy (hives) on chart   SIGNIFICANT EVENTS / STUDIES:  Intubated 09/15/2013 [facility] 08/31/2013    SUBJECTIVE/OVERNIGHT/INTERVAL HX  Remains on pressors afebrile   VITAL SIGNS: Filed Vitals:   09/09/13 0700 09/09/13 0736 09/09/13 0746 09/09/13 0800  BP: 79/45 96/76  88/68  Pulse:  68  67  Temp:   97.9 F (36.6 C)   TempSrc:   Oral   Resp: 22 22  15   Height:      Weight:      SpO2:  99%  100%      HEMODYNAMICS:   VENTILATOR SETTINGS: Vent Mode:  [-] PRVC FiO2 (%):  [30 %] 30 % Set Rate:  [28 bmp] 28 bmp Vt Set:  [420 mL] 420 mL PEEP:  [5 cmH20] 5 cmH20 Plateau Pressure:  [4 cmH20-21 cmH20] 20 cmH20 INTAKE / OUTPUT: I/O last 3 completed shifts: In: 1858.9 [I.V.:1388.9; NG/GT:120; IV Piggyback:350] Out: 1130 [Urine:830; Emesis/NG output:300]   PHYSICAL EXAMINATION: General:  Chronically and critically ill appearing.  HEENT: normal except for poor dentition and ETT Neck: supple. Thick. Unable to assess JVD. Carotids 2+ bilat; no bruits. No lymphadenopathy or thryomegaly appreciated. Cor: PMI nonpalpable. Regular rate & rhythm. Distant HS No obvious rubs, gallops or murmurs. Fungal rash  on chest Lungs: + rhonchi Abdomen: obese soft, nontender, nondistended. No hepatosplenomegaly. No bruits or masses. Good bowel sounds. Extremities: no cyanosis, clubbing, rash. Cool trace edema Neuro: . Follows commands. Nonfocal, RASS -1  LABS: PULMONARY  Recent Labs Lab 2013-09-25 0255 Sep 25, 2013 0354 09-25-13 0946 09/09/13 0401  PHART  --  7.191* 7.352 7.357  PCO2ART  --  47.1* 36.9 37.8  PO2ART  --  125.0* 144.0* 87.2  HCO3  --  17.4* 20.7 20.7  TCO2  --  18.8 22 21.8   O2SAT 70.5 96.1 99.0 67.0  94.9    CBC  Recent Labs Lab 09/25/2013 0300 09/25/2013 0500 09/09/13 0400  HGB 11.5* 11.8* 11.9*  HCT 36.6 38.0 37.3  WBC 22.6* 21.6* 20.1*  PLT 144* 128* 121*    COAGULATION  Recent Labs Lab 09/09/13 0400  INR 1.62*    CARDIAC    Recent Labs Lab September 25, 2013 0309 09/25/2013 0907 2013-09-25 1940 09/09/13 0400  TROPONINI <0.30 0.70* 0.44* 0.31*    Recent Labs Lab 2013-09-25 0343 09/09/13 0400  PROBNP 10714.0* 6702.0*     CHEMISTRY  Recent Labs Lab 25-Sep-2013 0300 09-25-13 0500 2013/09/25 1435 09/09/13 0400  NA 132* 132* 134* 133*  K 4.4 4.7 4.1 3.6*  CL 100 100 99 97  CO2 15* 17* 19 20  GLUCOSE 177* 166* 120* 104*  BUN 44* 49* 50* 42*  CREATININE 0.94 1.04 1.07 0.93  CALCIUM 7.0* 7.5* 7.7* 7.8*  MG  --   --   --  1.9  PHOS  --   --   --  2.6   Estimated Creatinine Clearance: 61.9 ml/min (by C-G formula based on Cr of 0.93).   LIVER  Recent Labs Lab 09-25-13 0300 09/09/13 0400  AST 678* 266*  ALT 495* 369*  ALKPHOS 89 91  BILITOT 1.2 0.7  PROT 5.1* 5.2*  ALBUMIN 2.1* 2.1*  INR  --  1.62*     INFECTIOUS  Recent Labs Lab September 25, 2013 0308 09-25-2013 0309 09-25-2013 1320 09/09/13 0400  LATICACIDVEN 2.5*  --   --  1.4  PROCALCITON  --  0.78 1.34 1.07     ENDOCRINE CBG (last 3)   Recent Labs  09/09/13 0012 09/09/13 0410 09/09/13 0751  GLUCAP 109* 96 74      IMAGING x48h  Ct Chest Wo Contrast  2013/09/25   CLINICAL DATA:  Evaluate empyema.  EXAM: CT CHEST WITHOUT CONTRAST  TECHNIQUE: Multidetector CT imaging of the chest was performed following the standard protocol without IV contrast.  COMPARISON:  September 25, 2013  FINDINGS: The heart size is moderately enlarged. There is no pericardial effusion. Calcified atherosclerotic disease involves the thoracic aorta as well as the LAD and RCA coronary arteries. Prominent mediastinal lymph nodes are identified in may be reactive. There is no axillary or supraclavicular  adenopathy.  Moderate volume left pleural effusion is identified. A moderate volume of loculated right pleural effusion is also identified. This may represent an empyema. Dense airspace consolidation containing air bronchograms is identified within both lower lobes as well as the right middle lobe. Patchy areas of ground-glass attenuation and airspace consolidation is identified within both upper lobes.  A small amount of perihepatic ascites is noted in the upper abdomen.  IMPRESSION: 1. Moderate volume, loculated right pleural effusion which may represent empyema. 2. Moderate simple appearing left pleural effusion. 3. Bilateral lower lobe airspace consolidation and atelectasis with patchy areas of pneumonitis within both upper lobes. Findings are compatible with multifocal pneumonia. 4. Atherosclerotic disease including multi  vessel coronary artery calcifications. 5. Ascites.   Electronically Signed   By: Signa Kell M.D.   On: 01-Oct-2013 16:42   Dg Chest Port 1 View  09/09/2013   CLINICAL DATA:  Pneumonia  EXAM: PORTABLE CHEST - 1 VIEW  COMPARISON:  CT CHEST W/O CM dated 10/01/2013; DG CHEST 1V PORT dated 10/01/13; DG CHEST 1V PORT dated 10/01/2013  FINDINGS: Grossly unchanged enlarged cardiac silhouette and mediastinal contours given decreased lung volumes and patient rotation to the left. Stable position of support apparatus. The pulmonary vasculature remains indistinct with cephalization lobe. Grossly unchanged small bilateral effusions with associated bilateral mid and lower lung heterogeneous/consolidative opacities. Unchanged bones.  IMPRESSION: 1.  Stable positioning of support apparatus.  No pneumothorax. 2. Grossly unchanged findings of pulmonary edema, small bilateral effusions and associated bibasilar mid and lower lung opacities worrisome for multifocal infection as demonstrated on recent chest CT.   Electronically Signed   By: Simonne Come M.D.   On: 09/09/2013 08:02   Dg Chest Port 1  View  10/01/2013   CLINICAL DATA:  Endotracheal tube advanced 2 cm.  EXAM: PORTABLE CHEST - 1 VIEW  COMPARISON:  Earlier today.  FINDINGS: The endotracheal tube is in satisfactory position and slightly lower than previously demonstrated. The remainder to of the examination is unchanged.  IMPRESSION: No significant change in probable bilateral pneumonia, pleural effusions and cardiomegaly.   Electronically Signed   By: Gordan Payment M.D.   On: 2013/10/01 03:38   Dg Chest Port 1 View  October 01, 2013   CLINICAL DATA:  Line placement.  EXAM: PORTABLE CHEST - 1 VIEW  COMPARISON:  12/30/2009.  FINDINGS: Dense patchy airspace opacity in both mid and lower lung zones. Probable bilateral pleural effusions. Endotracheal tube in satisfactory position. Right jugular catheter tip in the inferior aspect of the superior vena cava. No pneumothorax. Nasogastric tube extending into the stomach. Probable enlarged cardiac silhouette, difficult to assess due to the airspace opacity. Diffuse osteopenia. Stable surgical absence or distortion of the distal clavicles.  IMPRESSION: 1. Dense bilateral airspace opacity, most compatible with pneumonia. 2. Probable bilateral pleural effusions. 3. Probable cardiomegaly.   Electronically Signed   By: Gordan Payment M.D.   On: 10/01/2013 03:33       ASSESSMENT / PLAN:  PULMONARY A: 1) VDRF likely due to bilateral CAP -> septic shock,      2) Acute/chronic systolic HF d/t NICM EF 15-20% CT concerning for empyema right side  P:   Full vent support IR guided rt chest tube if family willing   CARDIOVASCULAR A: 1) PAF with RVR - s/p emergent DC-CV [facility] October 01, 2013      2) Acute/chronic systolic HF d/t NICM EF 15-20%      3) LBBB  4) Bedside echo at [facility] - ef 15% QTc  >564msec P:  Continue  amio and heparin.  Await  formal echo.  Levophed for BP support as needed.  Diurese once off pressors. Co-ox ok-does not need inotropes Check Ue Doppler (LEft hand with edema)  RENAL A:  Making urine. At risk for ATN P Monitor   GI A NPO P Start tube feeds  ID A:  1) Septic shock       2) CAP - severe pneumococcal CAP       3) PCN and levofloxacin allergy   - DX made of pneumococcal CAP, severe with septic shock. MRSA PCR positive  P:   Abx as above BCX and sputum cx.  Check  resp virus PCR for ccompletion    ENDOCRINE  A:  1) h/o hypothyroidism - non compliant P:     With check TSH/T4 - exclude component of myxedema.    DERM A: 1) Fungal rash P: Nystatin Diflucan 100 mg  X 7ds   CODE STATUS:  According to family patient often says she wishes she was dead and would not want life support. However daughter wants patient to remain FULL CODE.   GLOBAL - will need chest tube , prefer IR guided for possible rt empyema -try to avoid VATs  Here given co-morbidities    The patient is critically ill with multiple organ systems failure and requires high complexity decision making for assessment and support, frequent evaluation and titration of therapies, application of advanced monitoring technologies and extensive interpretation of multiple databases.   Critical Care Time devoted to patient care services described in this note is  45 Minutes.  09/09/2013 9:52 AM

## 2013-09-09 NOTE — Progress Notes (Signed)
eLink Physician-Brief Progress Note Patient Name: [facility] DOB: [birth year] MRN: [id number]  Date of Service  09/09/2013   HPI/Events of Note  Pt back from CT placement  eICU Interventions  Resume heparin drip    Intervention Category Minor Interventions: Routine modifications to care plan (e.g. PRN medications for pain, fever)  Storm Frisk 09/09/2013, 5:35 PM

## 2013-09-09 NOTE — Progress Notes (Signed)
Pharmacy Consult Note  Pharmacy Consult for Heparin Indication: atrial fibrillation  Patient Measurements: Height: 5' (152.4 cm) Weight: 234 lb 12.8 oz (106.505 kg) IBW/kg (Calculated) : 45.5 Dosing Weight: 70 kg  Vital Signs: Temp: 98.9 F (37.2 C) (05/18 0000) Temp src: Axillary (05/18 0000) BP: 91/35 mmHg (05/17 2300) Pulse Rate: 62 (05/18 0000)  Labs:  Recent Labs  08/27/2013 0300 08/28/2013 0309 08/26/2013 0500 09/02/2013 0907 08/27/2013 1400 08/23/2013 1435 09/17/2013 1940 09/09/13 0035  HGB 11.5*  --  11.8*  --   --   --   --   --   HCT 36.6  --  38.0  --   --   --   --   --   PLT 144*  --  128*  --   --   --   --   --   HEPARINUNFRC  --   --   --   --  0.20*  --   --  0.35  CREATININE 0.94  --  1.04  --   --  1.07  --   --   TROPONINI  --  <0.30  --  0.70*  --   --  0.44*  --     Estimated Creatinine Clearance: 54 ml/min (by C-G formula based on Cr of 1.07).  Assessment: 73 yo female with Afib for heparin  Goal of Therapy:  Heparin level 0.3-0.7 units/ml Monitor platelets by anticoagulation protocol: Yes   Plan:  Continue Heparin at current rate Follow-up am labs.  Geannie Risen, PharmD, BCPS

## 2013-09-09 NOTE — Progress Notes (Signed)
Dr. Kalman Shan, M.D., [facility].C.P Pulmonary and Critical Care Medicine Staff Physician  System [place] [facility] Pager: [phone], If no answer or between  15:00h - 7:00h: call [phone]  09/09/2013 6:51 PM

## 2013-09-09 NOTE — Progress Notes (Signed)
eLink Physician-Brief Progress Note Patient Name: JAHNEA LANGLEY DOB: 1941/01/30 MRN: [id number]  Date of Service  09/09/2013   HPI/Events of Note  Agitation, fent drop BP  eICU Interventions  Add int versed    Intervention Category Minor Interventions: Routine modifications to care plan (e.g. PRN medications for pain, fever);Agitation / anxiety - evaluation and management  Nelda Bucks 09/09/2013, 1:14 AM

## 2013-09-10 ENCOUNTER — Encounter (HOSPITAL_COMMUNITY): Payer: Self-pay | Admitting: Thoracic Surgery (Cardiothoracic Vascular Surgery)

## 2013-09-10 ENCOUNTER — Inpatient Hospital Stay (HOSPITAL_COMMUNITY): Payer: Managed Care, Other (non HMO)

## 2013-09-10 DIAGNOSIS — I359 Nonrheumatic aortic valve disorder, unspecified: Secondary | ICD-10-CM

## 2013-09-10 DIAGNOSIS — J90 Pleural effusion, not elsewhere classified: Secondary | ICD-10-CM

## 2013-09-10 DIAGNOSIS — J13 Pneumonia due to Streptococcus pneumoniae: Secondary | ICD-10-CM

## 2013-09-10 DIAGNOSIS — J9620 Acute and chronic respiratory failure, unspecified whether with hypoxia or hypercapnia: Secondary | ICD-10-CM

## 2013-09-10 DIAGNOSIS — R6520 Severe sepsis without septic shock: Secondary | ICD-10-CM

## 2013-09-10 DIAGNOSIS — Z515 Encounter for palliative care: Secondary | ICD-10-CM

## 2013-09-10 DIAGNOSIS — A419 Sepsis, unspecified organism: Secondary | ICD-10-CM

## 2013-09-10 HISTORY — DX: Pleural effusion, not elsewhere classified: J90

## 2013-09-10 LAB — CBC
HEMATOCRIT: 35.8 % — AB (ref 36.0–46.0)
HEMOGLOBIN: 11.2 g/dL — AB (ref 12.0–15.0)
MCH: 29 pg (ref 26.0–34.0)
MCHC: 31.3 g/dL (ref 30.0–36.0)
MCV: 92.7 fL (ref 78.0–100.0)
Platelets: 106 10*3/uL — ABNORMAL LOW (ref 150–400)
RBC: 3.86 MIL/uL — AB (ref 3.87–5.11)
RDW: 17.5 % — ABNORMAL HIGH (ref 11.5–15.5)
WBC: 16.3 10*3/uL — ABNORMAL HIGH (ref 4.0–10.5)

## 2013-09-10 LAB — BASIC METABOLIC PANEL
BUN: 30 mg/dL — AB (ref 6–23)
CHLORIDE: 100 meq/L (ref 96–112)
CO2: 24 meq/L (ref 19–32)
Calcium: 7.5 mg/dL — ABNORMAL LOW (ref 8.4–10.5)
Creatinine, Ser: 0.73 mg/dL (ref 0.50–1.10)
GFR calc Af Amer: >90 mL/min (ref >90)
GFR, EST NON AFRICAN AMERICAN: 85 mL/min — AB (ref >90)
GLUCOSE: 131 mg/dL — AB (ref 70–99)
POTASSIUM: 3.3 meq/L — AB (ref 3.7–5.3)
SODIUM: 136 meq/L — AB (ref 137–147)

## 2013-09-10 LAB — LACTATE DEHYDROGENASE, PLEURAL OR PERITONEAL FLUID: LD, Fluid: 1174 U/L — ABNORMAL HIGH (ref 3–23)

## 2013-09-10 LAB — HEPATIC FUNCTION PANEL
ALBUMIN: 1.9 g/dL — AB (ref 3.5–5.2)
ALK PHOS: 105 U/L (ref 39–117)
ALT: 241 U/L — ABNORMAL HIGH (ref 0–35)
AST: 147 U/L — ABNORMAL HIGH (ref 0–37)
Bilirubin, Direct: 0.3 mg/dL (ref 0.0–0.3)
Indirect Bilirubin: 0.2 mg/dL — ABNORMAL LOW (ref 0.3–0.9)
TOTAL PROTEIN: 5 g/dL — AB (ref 6.0–8.3)
Total Bilirubin: 0.5 mg/dL (ref 0.3–1.2)

## 2013-09-10 LAB — GLUCOSE, CAPILLARY
GLUCOSE-CAPILLARY: 149 mg/dL — AB (ref 70–99)
Glucose-Capillary: 118 mg/dL — ABNORMAL HIGH (ref 70–99)
Glucose-Capillary: 124 mg/dL — ABNORMAL HIGH (ref 70–99)
Glucose-Capillary: 125 mg/dL — ABNORMAL HIGH (ref 70–99)
Glucose-Capillary: 131 mg/dL — ABNORMAL HIGH (ref 70–99)
Glucose-Capillary: 91 mg/dL (ref 70–99)

## 2013-09-10 LAB — HEPARIN LEVEL (UNFRACTIONATED): HEPARIN UNFRACTIONATED: 0.29 [IU]/mL — AB (ref 0.30–0.70)

## 2013-09-10 LAB — PHOSPHORUS: PHOSPHORUS: 2.9 mg/dL (ref 2.3–4.6)

## 2013-09-10 LAB — BODY FLUID CELL COUNT WITH DIFFERENTIAL
EOS FL: 0 %
LYMPHS FL: 18 %
Monocyte-Macrophage-Serous Fluid: 7 % — ABNORMAL LOW (ref 50–90)
NEUTROPHIL FLUID: 75 % — AB (ref 0–25)
Total Nucleated Cell Count, Fluid: 113 cu mm (ref 0–1000)

## 2013-09-10 LAB — CARBOXYHEMOGLOBIN
Carboxyhemoglobin: 0.8 % (ref 0.5–1.5)
METHEMOGLOBIN: 1.3 % (ref 0.0–1.5)
O2 Saturation: 65 %
Total hemoglobin: 11.6 g/dL — ABNORMAL LOW (ref 12.0–16.0)

## 2013-09-10 LAB — CULTURE, RESPIRATORY

## 2013-09-10 LAB — PROTIME-INR
INR: 1.53 — AB (ref 0.00–1.49)
PROTHROMBIN TIME: 18 s — AB (ref 11.6–15.2)

## 2013-09-10 LAB — MAGNESIUM: Magnesium: 1.7 mg/dL (ref 1.5–2.5)

## 2013-09-10 LAB — PROTEIN, BODY FLUID: TOTAL PROTEIN, FLUID: 3.1 g/dL

## 2013-09-10 LAB — PROCALCITONIN: Procalcitonin: 0.52 ng/mL

## 2013-09-10 MED ORDER — PERFLUTREN LIPID MICROSPHERE
1.0000 mL | INTRAVENOUS | Status: AC | PRN
  Administered 2013-09-10: 2 mL via INTRAVENOUS
  Filled 2013-09-10: qty 10

## 2013-09-10 MED ORDER — DOCUSATE SODIUM 50 MG/5ML PO LIQD
100.0000 mg | Freq: Every day | ORAL | Status: DC
  Administered 2013-09-10: 100 mg via ORAL
  Filled 2013-09-10 (×3): qty 10

## 2013-09-10 MED ORDER — POTASSIUM CHLORIDE 20 MEQ/15ML (10%) PO LIQD
20.0000 meq | ORAL | Status: AC
  Administered 2013-09-10 (×2): 20 meq
  Filled 2013-09-10 (×2): qty 15

## 2013-09-10 MED ORDER — PERFLUTREN LIPID MICROSPHERE
INTRAVENOUS | Status: AC
  Filled 2013-09-10: qty 10

## 2013-09-10 NOTE — Progress Notes (Signed)
[facility] ICU Electrolyte Replacement Protocol  Patient Name: AIMY SWEETING DOB: Apr 13, 1941 MRN: [id number]  Date of Service  09/10/2013   HPI/Events of Note    Recent Labs Lab 09/19/2013 0300 08/24/2013 0500 09/04/2013 1435 09/09/13 0400 09/10/13 0444  NA 132* 132* 134* 133* 136*  K 4.4 4.7 4.1 3.6* 3.3*  CL 100 100 99 97 100  CO2 15* 17* $Remov'19 20 24  'ghCUGj$ GLUCOSE 177* 166* 120* 104* 131*  BUN 44* 49* 50* 42* 30*  CREATININE 0.94 1.04 1.07 0.93 0.73  CALCIUM 7.0* 7.5* 7.7* 7.8* 7.5*  MG  --   --   --  1.9 1.7  PHOS  --   --   --  2.6 2.9    Estimated Creatinine Clearance: 72.9 ml/min (by C-G formula based on Cr of 0.73).  Intake/Output     05/18 0701 - 05/19 0700   I.V. (mL/kg) 1233.7 (11.4)   NG/GT 488.8   IV Piggyback 350   Total Intake(mL/kg) 2072.4 (19.2)   Urine (mL/kg/hr) 1000 (0.4)   Emesis/NG output 50 (0)   Drains 990 (0.4)   Total Output 2040   Net +32.4        - I/O DETAILED x24h    Total I/O In: 1144.6 [I.V.:684.6; NG/GT:310; IV Piggyback:150] Out: 450 [Urine:450] - I/O THIS SHIFT    ASSESSMENT   eICURN Interventions  K+3.3  ICU Electrolyte Replacement Protocol criteria met.Labs Replaced per protocol.MD notified    ASSESSMENT: MAJOR ELECTROLYTE    Lorene Dy 09/10/2013, 5:48 AM

## 2013-09-10 NOTE — Progress Notes (Signed)
Echocardiogram 2D Echocardiogram with Definity has been performed.  [facility] 09/10/2013, 11:56 AM

## 2013-09-10 NOTE — Progress Notes (Signed)
Patient: Cathy Robles / Admit Date: 08/28/2013 / Date of Encounter: 09/10/2013, 10:20 AM  Subjective  Remains intubated. Does arouse to sound of voice and look around, alert, nods in response to voice.  Objective   Telemetry: NSR. Very brief SVT overnight  Physical Exam: Blood pressure 98/65, pulse 75, temperature 97.2 F (36.2 C), temperature source Axillary, resp. rate 22, height 5' (1.524 m), weight 238 lb 8.6 oz (108.2 kg), SpO2 100.00%. General: Well developed obese WF in no acute distress  Head: Normocephalic, atraumatic, sclera non-icteric, no xanthomas, nares are without discharge.  Neck: JVP not elevated.  Lungs: Intubated. Coarse BS bilaterally. Breathing is unlabored.  Heart: Reg rhythm, controlled rate, S1 S2 without murmurs, rubs, or gallops.  Abdomen: Soft, non-tender, non-distended with normoactive bowel sounds. No rebound/guarding.  Extremities: No clubbing or cyanosis. Tr edema. Distal pedal pulses are 2+ and equal bilaterally.  Neuro: Arouses to sound of voice and follows simple commands.  Intake/Output Summary (Last 24 hours) at 09/10/13 1020 Last data filed at 09/10/13 0800  Gross per 24 hour  Intake 2189.21 ml  Output   2120 ml  Net  69.21 ml    Inpatient Medications:  . antiseptic oral rinse  15 mL Mouth Rinse QID  . cefTRIAXone (ROCEPHIN)  IV  2 g Intravenous Q24H  . chlorhexidine  15 mL Mouth Rinse BID  . docusate  100 mg Oral Daily  . feeding supplement (PRO-STAT SUGAR FREE 64)  30 mL Per Tube TID  . feeding supplement (VITAL HIGH PROTEIN)  1,000 mL Per Tube Q24H  . insulin aspart  2-6 Units Subcutaneous 6 times per day  . levothyroxine  150 mcg Oral QAC breakfast  . multivitamin  5 mL Per Tube Daily  . nystatin cream   Topical TID  . pantoprazole sodium  40 mg Per Tube Q1200  . pneumococcal 23 valent vaccine  0.5 mL Intramuscular Tomorrow-1000  . sodium chloride  10-40 mL Intracatheter Q12H  . vancomycin  750 mg Intravenous Q12H   Infusions:    . sodium chloride 10 mL/hr at 09/10/13 0800  . amiodarone 30 mg/hr (09/10/13 0800)  . fentaNYL infusion INTRAVENOUS 100 mcg/hr (09/10/13 0800)  . heparin 1,250 Units/hr (09/10/13 0800)  . norepinephrine (LEVOPHED) Adult infusion 7 mcg/min (09/10/13 0800)    Labs:  Recent Labs  09/09/13 0400 09/10/13 0444  NA 133* 136*  K 3.6* 3.3*  CL 97 100  CO2 20 24  GLUCOSE 104* 131*  BUN 42* 30*  CREATININE 0.93 0.73  CALCIUM 7.8* 7.5*  MG 1.9 1.7  PHOS 2.6 2.9    Recent Labs  09/09/13 0400 09/10/13 0444  AST 266* 147*  ALT 369* 241*  ALKPHOS 91 105  BILITOT 0.7 0.5  PROT 5.2* 5.0*  ALBUMIN 2.1* 1.9*    Recent Labs  09/09/2013 0300  09/09/13 0400 09/10/13 0444  WBC 22.6*  < > 20.1* 16.3*  NEUTROABS 20.4*  --   --   --   HGB 11.5*  < > 11.9* 11.2*  HCT 36.6  < > 37.3 35.8*  MCV 92.9  < > 91.0 92.7  PLT 144*  < > 121* 106*  < > = values in this interval not displayed.  Recent Labs  08/25/2013 0309 09/13/2013 0907 09/03/2013 1940 09/09/13 0400  TROPONINI <0.30 0.70* 0.44* 0.31*   No components found with this basename: POCBNP,   Recent Labs  08/23/2013 0500  HGBA1C 5.9*     Radiology/Studies:  Ct Chest Wo Contrast  06-18-13   CLINICAL DATA:  Evaluate empyema.  EXAM: CT CHEST WITHOUT CONTRAST  TECHNIQUE: Multidetector CT imaging of the chest was performed following the standard protocol without IV contrast.  COMPARISON:  002-24-15  FINDINGS: The heart size is moderately enlarged. There is no pericardial effusion. Calcified atherosclerotic disease involves the thoracic aorta as well as the LAD and RCA coronary arteries. Prominent mediastinal lymph nodes are identified in may be reactive. There is no axillary or supraclavicular adenopathy.  Moderate volume left pleural effusion is identified. A moderate volume of loculated right pleural effusion is also identified. This may represent an empyema. Dense airspace consolidation containing air bronchograms is identified within  both lower lobes as well as the right middle lobe. Patchy areas of ground-glass attenuation and airspace consolidation is identified within both upper lobes.  A small amount of perihepatic ascites is noted in the upper abdomen.  IMPRESSION: 1. Moderate volume, loculated right pleural effusion which may represent empyema. 2. Moderate simple appearing left pleural effusion. 3. Bilateral lower lobe airspace consolidation and atelectasis with patchy areas of pneumonitis within both upper lobes. Findings are compatible with multifocal pneumonia. 4. Atherosclerotic disease including multi vessel coronary artery calcifications. 5. Ascites.   Electronically Signed   By: Signa Kellaylor  Stroud M.D.   On: 002-24-15 16:42   Dg Chest Port 1 View  09/10/2013   CLINICAL DATA:  73 year old female with pleural effusion. Lower lobe consolidation. Acute respiratory failure. Initial encounter.  EXAM: PORTABLE CHEST - 1 VIEW  COMPARISON:  09/09/2013 and earlier.  FINDINGS: Portable AP semi upright view at 0610 hrs. New right pigtail pleural catheter in place. Density at the right lung base not significantly changed. No pneumothorax.  Stable endotracheal tube tip at the level the clavicles. Stable right IJ approach central line tip at the level of the cavoatrial junction. Stable visualized enteric tube.  The patient is now more rotated to the left. Veiling opacity at the left lung base is stable.  IMPRESSION: 1. Right pigtail pleural catheter placed. No pneumothorax. Density at the right lung base not significantly changed. 2.  Otherwise, stable lines and tubes. 3. Stable left lung base density representing a combination of pleural fluid and consolidation, as seen on CT 002-24-15.   Electronically Signed   By: Augusto GambleLee  Hall M.D.   On: 09/10/2013 07:54   Dg Chest Port 1 View  09/09/2013   CLINICAL DATA:  Pneumonia  EXAM: PORTABLE CHEST - 1 VIEW  COMPARISON:  CT CHEST W/O CM dated 06-18-13; DG CHEST 1V PORT dated 06-18-13; DG CHEST 1V PORT  dated 06-18-13  FINDINGS: Grossly unchanged enlarged cardiac silhouette and mediastinal contours given decreased lung volumes and patient rotation to the left. Stable position of support apparatus. The pulmonary vasculature remains indistinct with cephalization lobe. Grossly unchanged small bilateral effusions with associated bilateral mid and lower lung heterogeneous/consolidative opacities. Unchanged bones.  IMPRESSION: 1.  Stable positioning of support apparatus.  No pneumothorax. 2. Grossly unchanged findings of pulmonary edema, small bilateral effusions and associated bibasilar mid and lower lung opacities worrisome for multifocal infection as demonstrated on recent chest CT.   Electronically Signed   By: Simonne ComeJohn  Watts M.D.   On: 09/09/2013 08:02   Dg Chest Port 1 View  06-18-13   CLINICAL DATA:  Endotracheal tube advanced 2 cm.  EXAM: PORTABLE CHEST - 1 VIEW  COMPARISON:  Earlier today.  FINDINGS: The endotracheal tube is in satisfactory position and slightly lower than previously demonstrated. The remainder to of the examination is  unchanged.  IMPRESSION: No significant change in probable bilateral pneumonia, pleural effusions and cardiomegaly.   Electronically Signed   By: Gordan Payment M.D.   On: 09/15/2013 03:38   Dg Chest Port 1 View  09/18/2013   CLINICAL DATA:  Line placement.  EXAM: PORTABLE CHEST - 1 VIEW  COMPARISON:  12/30/2009.  FINDINGS: Dense patchy airspace opacity in both mid and lower lung zones. Probable bilateral pleural effusions. Endotracheal tube in satisfactory position. Right jugular catheter tip in the inferior aspect of the superior vena cava. No pneumothorax. Nasogastric tube extending into the stomach. Probable enlarged cardiac silhouette, difficult to assess due to the airspace opacity. Diffuse osteopenia. Stable surgical absence or distortion of the distal clavicles.  IMPRESSION: 1. Dense bilateral airspace opacity, most compatible with pneumonia. 2. Probable bilateral  pleural effusions. 3. Probable cardiomegaly.   Electronically Signed   By: Gordan Payment M.D.   On: 09/17/2013 03:33   Ct Image Guided Drainage By Percutaneous Catheter  09/09/2013   CLINICAL DATA:  Respiratory failure, pneumonia, loculated pleural effusion.  EXAM: CT GUIDED RIGHT PLEURAL DRAIN PLACEMENT  ANESTHESIA/SEDATION: Intravenous Fentanyl and Versed were administered as conscious sedation during continuous cardiorespiratory monitoring by the radiology RN, with a total moderate sedation time of 15 minutes.  PROCEDURE: The procedure, risks, benefits, and alternatives were explained to the family. Questions regarding the procedure were encouraged and answered. The family understands and consents to the procedure.  Patient placed in left lateral decubitus positioning. Limited axial scans through the thorax obtained. An appropriate skin entry site was identified.  The right lateral chest wall was prepped with Betadinein a sterile fashion, and a sterile drape was applied covering the operative field. A sterile gown and sterile gloves were used for the procedure. Local anesthesia was provided with 1% Lidocaine.  Under CT fluoroscopic guidance, a 19 gauge percutaneous entry needle was advanced into the right pleural space. Cloudy greenish 10 fluid returned. Amplatz wire advanced easily, its position confirmed on CT fluoroscopy. Tract was dilated to allow placement of a 14 French pigtail catheter, formed with in the dependent aspect of the effusion. CT confirms appropriate positioning. Approximately 10 mL of slightly cloudy amber fluid were aspirated, sent for routine Gram stain and culture. Catheter was secured externally with 0-Prolene suture and StatLock and placed to a Pleur-Evac at 20 cmH2O suction. The patient tolerated the procedure well.  COMPLICATIONS: None immediate  FINDINGS: Limited scans demonstrate a partially loculated bilateral pleural effusions. Airspace consolidation in both lower lobes with air  bronchograms again evident. #14-French pigtail catheter placed in the loculated right pleural effusion.  IMPRESSION: 1. Technically successful CT-guided right pleural drain catheter placement. A sample of the aspirate sent for routine Gram stain and culture.   Electronically Signed   By: Oley Balm M.D.   On: 09/09/2013 17:28     Assessment and Plan  73 y/o F with history of medical noncompliance (meds/appts for 2-3 yrs), NICM, PAF, minimal plaque by cath 2011, ongoing tobacco abuse presented to [facility] with SBP 60s, acute respiratory failure and rapid AF s/p emergent DCCV then transferred to [facility]. S/p chest tube for R empyema 5/18.  1. Acute ventilator-dependent respiratory failure due to CAP 2. Septic shock 3. Bilateral pneumococcal CAP with R empyema s/p chest tube 09/09/13 4. NSTEMI likely demand ischemia, cath 2011: minimal plaque 5. Nonischemic cardiomyopathy 15-20% 6. Paroxysmal atrial fibillrilation with RVR s/p emergent DCCV at [facility] 09/15/2013 7. Medical noncompliance (discharged from practice due to habitual  nonattendance) 8. Hypothyroidism, noncompliant with thyroid replacement, TSH 23 9. Chronic LBBB 10. Ongoing tobacco use 11. Elevated PT/INR ? Auto-anticoagulation 12. Transaminitis ? Shock liver - improving 13. Morbid obesity Body mass index is 45.6 kg/(m^2). 14. Hypokalemia 15. Thrombocytopenia 16. Severe hypoalbuminemia 1.9  Currently maintaining NSR on amiodarone gtt @ 30mg /hr but BP remains tenuous requiring levophed. Unable to titrate CHF meds due to hypotension. Predominant problem felt to be sepsis/PNA - on abx per PCCM. May need to trend INR given concomitant heparin use for AF (s/p DCCV) and possible shock liver - LFTs improving. Continue IV amio - plan to transition to oral once taking POs. Will defer lyte management to primary team. She will likely need SNF if clinical picture resolves after discharge as she does not appear to have been  capable of caring for herself at home (noncompliance issues predominately). 2D Echo pending. Prognosis guarded. Palliative care is planning for GOC meeting tomorrow at 9am.  Signed, Ronie Spies PA-C  I have examined the patient and reviewed assessment and plan and discussed with patient.  Agree with above as stated.  Maintaining NSR at this time. Continue IV Amiodarone.  Corky Crafts

## 2013-09-10 NOTE — Progress Notes (Signed)
Thank you for consulting the Palliative Medicine Team at [facility] to meet your patient's and family's needs.   The reason that you asked [place] to see your patient is  For  Clarification of GOC and options  We have scheduled your patient for a meeting: tomorrow 09-11-13 at 0900 with Lorinda Creed NP  The Surrogate decision make is:  Husband, Eddie Jarvis Morgan NP  Palliative Medicine Team Team Phone # [phone] Pager [phone]

## 2013-09-10 NOTE — Progress Notes (Signed)
Pharmacy Consult Note  Pharmacy Consult for Heparin Indication: atrial fibrillation  Patient Measurements: Height: 5' (152.4 cm) Weight: 238 lb 8.6 oz (108.2 kg) IBW/kg (Calculated) : 45.5 Dosing Weight: 70 kg  Vital Signs: Temp: 97.2 F (36.2 C) (05/19 0814) Temp src: Axillary (05/19 0814) BP: 98/64 mmHg (05/19 1000) Pulse Rate: 70 (05/19 1000)  Labs:  Recent Labs  2013-09-22 0500 2013/09/22 0907  2013/09/22 1435 22-Sep-2013 1940 09/09/13 0035 09/09/13 0400 09/10/13 0444  HGB 11.8*  --   --   --   --   --  11.9* 11.2*  HCT 38.0  --   --   --   --   --  37.3 35.8*  PLT 128*  --   --   --   --   --  121* 106*  LABPROT  --   --   --   --   --   --  18.8* 18.0*  INR  --   --   --   --   --   --  1.62* 1.53*  HEPARINUNFRC  --   --   < >  --   --  0.35 0.38 0.29*  CREATININE 1.04  --   --  1.07  --   --  0.93 0.73  TROPONINI  --  0.70*  --   --  0.44*  --  0.31*  --   < > = values in this interval not displayed.  Estimated Creatinine Clearance: 72.9 ml/min (by C-G formula based on Cr of 0.73).  Assessment: 73 yo female with Afib for heparin Heparin level slightly sub-therapeutic today Heparin off 5/18 for chest tube placement  Goal of Therapy:  Heparin level 0.3-0.7 units/ml Monitor platelets by anticoagulation protocol: Yes   Plan:  Increase heparin slightly to 1300 units / hr Follow-up am labs.  Thank you. Okey Regal, PharmD [phone]

## 2013-09-10 NOTE — Progress Notes (Signed)
PULMONARY  / CRITICAL CARE MEDICINE  Name: Cathy Robles MRN: [id number] DOB: 03/06/1941 PCP Bennie Pierini, FNP   ADMISSION DATE:  09/23/2013 LOS 2 days  REFERRING MD :  [facility] PRIMARY SERVICE: CCM  CHIEF COMPLAINT:  PNA/Respiratory failure  BRIEF PATIENT DESCRIPTION:    73 y/o obese smoker  with PAF, CHF due to NICM EF 20%, LBBB, chronic pain syndrome, hypothyroidism and COPD with ongoing tobacco use admitted from [facility] on 2013-09-23 for respiratory failure due to bilateral PNA.   Admitted in 2011 with respiratory failure and CHF in setting of AF with RVR. Echo Ef 25%. Underwent cath. Normal coronary arteries EF 40%. Underwent DC-CV. Placed on amio and Pradaxa. Last saw Dr. Antoine Poche in 2012 and was in NSR, dc'd from cardiology due to repeated no-shows.  According to her family she lives in her rcliner and only moves to get up to go to bedside commode. Has chronic pain and takes [employer] frequently but not taking any of her other meds. Has not seen a doctor in years. Continues to smoke 1-1.5 ppd. Has chronic cough. Over past week cough much worse and more dyspneic. Has had wife sputum and occasional blood tinged sputum. Occasion CP but unchanged. Daughter came to visit today and was severely dyspneic and called 911. Taken to [facility]. Was hypotensive (SBP 60s) and in extreme respiratory distress as well as AF with RVR.  Intubated in ER. CXR with bilateral infiltrates PNA vs CHF. Treated with Vancomycin and Levofloxacin. Underwent emergent DC-CV of AF. Started on neo without much benefit. Levophed added in ambulance.   On arrival, awake and following commands. Central line placed with co-ox 71%. Bedside echo LVEF 15-20%. RV normal.   Labs from [place]. WBC 23K. BNP 1110. Trop normal. UA negative  LINES / TUBES: RIJ CVL 2013-09-23 >>> R radial arterial line 2013-09-23>>> ETT 09-23-2013 [facility]) >>> Rt chest tube (IR) 5/18 >>  CULTURES: BCX x 2  (23-Sep-2013) >>> Sputum CX (Pending) >> UA (09-23-2013 >> negative at [facility] ....... Urine strep - POSITIVE Urine leg neg resp virus pcr tracheal aspirate  Pl fluid rt >>   ANTIBIOTICS: Vancomycin 2013-09-23 >>> Cefipime 08/29/13 >>>5/17, CEftiraxone 5/17 >>  Levofloxacin x 1 at [facility] 08/29/13 >>> d/c'd due to reported allergy  PCN alllergy (hives) on chart   SIGNIFICANT EVENTS / STUDIES:  Intubated 09/23/2013 DC-CV of AF at [facility] 2013/09/23 Rt chest tube (IR) 5/18 Duplex LUE neg 5/18    SUBJECTIVE/OVERNIGHT/INTERVAL HX Remains on levophed  Afebrile Not in pain   VITAL SIGNS: Filed Vitals:   09/10/13 0700 09/10/13 0800 09/10/13 0814 09/10/13 0830  BP: 91/62 100/73  98/65  Pulse:  69  75  Temp:   97.2 F (36.2 C)   TempSrc:   Axillary   Resp: 18 22  22   Height:      Weight:      SpO2:  90%  100%      HEMODYNAMICS:   VENTILATOR SETTINGS: Vent Mode:  [-] PRVC FiO2 (%):  [30 %] 30 % Set Rate:  [28 bmp] 28 bmp Vt Set:  [420 mL] 420 mL PEEP:  [5 cmH20] 5 cmH20 Plateau Pressure:  [4 cmH20-21 cmH20] 20 cmH20 INTAKE / OUTPUT: I/O last 3 completed shifts: In: 3212 [I.V.:2063.3; NG/GT:648.8; IV Piggyback:500] Out: 2865 [Urine:1605; Emesis/NG output:250; Chest Tube:1010]   PHYSICAL EXAMINATION: General:  Chronically and critically ill appearing.  HEENT: normal except for poor dentition and ETT Neck: supple. Thick. Unable to assess JVD. Carotids  2+ bilat; no bruits. No lymphadenopathy or thryomegaly appreciated. Cor: PMI nonpalpable. Regular rate & rhythm. Distant HS No obvious rubs, gallops or murmurs. Fungal rash on chest Lungs: + rhonchi, rt chest tube serosanguinous fluid Abdomen: obese soft, nontender, nondistended. No hepatosplenomegaly. No bruits or masses. Good bowel sounds. Extremities: no cyanosis, clubbing, rash. Cool trace edema Neuro: . Follows commands. Nonfocal, RASS -1  LABS: PULMONARY  Recent Labs Lab Oct 05, 2013 0255 10-05-13 0354 2013/10/05 0946  09/09/13 0401 09/10/13 0448  PHART  --  7.191* 7.352 7.357  --   PCO2ART  --  47.1* 36.9 37.8  --   PO2ART  --  125.0* 144.0* 87.2  --   HCO3  --  17.4* 20.7 20.7  --   TCO2  --  18.8 22 21.8  --   O2SAT 70.5 96.1 99.0 67.0  94.9 65.0    CBC  Recent Labs Lab 10/05/13 0500 09/09/13 0400 09/10/13 0444  HGB 11.8* 11.9* 11.2*  HCT 38.0 37.3 35.8*  WBC 21.6* 20.1* 16.3*  PLT 128* 121* 106*    COAGULATION  Recent Labs Lab 09/09/13 0400 09/10/13 0444  INR 1.62* 1.53*    CARDIAC    Recent Labs Lab Oct 05, 2013 0309 2013-10-05 0907 Oct 05, 2013 1940 09/09/13 0400  TROPONINI <0.30 0.70* 0.44* 0.31*    Recent Labs Lab 10/05/13 0343 09/09/13 0400  PROBNP 10714.0* 6702.0*     CHEMISTRY  Recent Labs Lab 10/05/2013 0300 Oct 05, 2013 0500 10/05/2013 1435 09/09/13 0400 09/10/13 0444  NA 132* 132* 134* 133* 136*  K 4.4 4.7 4.1 3.6* 3.3*  CL 100 100 99 97 100  CO2 15* 17* 19 20 24   GLUCOSE 177* 166* 120* 104* 131*  BUN 44* 49* 50* 42* 30*  CREATININE 0.94 1.04 1.07 0.93 0.73  CALCIUM 7.0* 7.5* 7.7* 7.8* 7.5*  MG  --   --   --  1.9 1.7  PHOS  --   --   --  2.6 2.9   Estimated Creatinine Clearance: 72.9 ml/min (by C-G formula based on Cr of 0.73).   LIVER  Recent Labs Lab 2013-10-05 0300 09/09/13 0400 09/10/13 0444  AST 678* 266* 147*  ALT 495* 369* 241*  ALKPHOS 89 91 105  BILITOT 1.2 0.7 0.5  PROT 5.1* 5.2* 5.0*  ALBUMIN 2.1* 2.1* 1.9*  INR  --  1.62* 1.53*     INFECTIOUS  Recent Labs Lab October 05, 2013 0308  2013-10-05 1320 09/09/13 0400 09/10/13 0444  LATICACIDVEN 2.5*  --   --  1.4  --   PROCALCITON  --   < > 1.34 1.07 0.52  < > = values in this interval not displayed.   ENDOCRINE CBG (last 3)   Recent Labs  09/09/13 2352 09/10/13 0404 09/10/13 0810  GLUCAP 118* 131* 149*      IMAGING x48h  Ct Chest Wo Contrast  10-05-13   CLINICAL DATA:  Evaluate empyema.  EXAM: CT CHEST WITHOUT CONTRAST  TECHNIQUE: Multidetector CT imaging of the  chest was performed following the standard protocol without IV contrast.  COMPARISON:  10/05/2013  FINDINGS: The heart size is moderately enlarged. There is no pericardial effusion. Calcified atherosclerotic disease involves the thoracic aorta as well as the LAD and RCA coronary arteries. Prominent mediastinal lymph nodes are identified in may be reactive. There is no axillary or supraclavicular adenopathy.  Moderate volume left pleural effusion is identified. A moderate volume of loculated right pleural effusion is also identified. This may represent an empyema. Dense airspace consolidation containing air bronchograms  is identified within both lower lobes as well as the right middle lobe. Patchy areas of ground-glass attenuation and airspace consolidation is identified within both upper lobes.  A small amount of perihepatic ascites is noted in the upper abdomen.  IMPRESSION: 1. Moderate volume, loculated right pleural effusion which may represent empyema. 2. Moderate simple appearing left pleural effusion. 3. Bilateral lower lobe airspace consolidation and atelectasis with patchy areas of pneumonitis within both upper lobes. Findings are compatible with multifocal pneumonia. 4. Atherosclerotic disease including multi vessel coronary artery calcifications. 5. Ascites.   Electronically Signed   By: Signa Kellaylor  Stroud M.D.   On: 08/28/2013 16:42   Dg Chest Port 1 View  09/10/2013   CLINICAL DATA:  73 year old female with pleural effusion. Lower lobe consolidation. Acute respiratory failure. Initial encounter.  EXAM: PORTABLE CHEST - 1 VIEW  COMPARISON:  09/09/2013 and earlier.  FINDINGS: Portable AP semi upright view at 0610 hrs. New right pigtail pleural catheter in place. Density at the right lung base not significantly changed. No pneumothorax.  Stable endotracheal tube tip at the level the clavicles. Stable right IJ approach central line tip at the level of the cavoatrial junction. Stable visualized enteric tube.   The patient is now more rotated to the left. Veiling opacity at the left lung base is stable.  IMPRESSION: 1. Right pigtail pleural catheter placed. No pneumothorax. Density at the right lung base not significantly changed. 2.  Otherwise, stable lines and tubes. 3. Stable left lung base density representing a combination of pleural fluid and consolidation, as seen on CT 09/10/2013.   Electronically Signed   By: Augusto GambleLee  Hall M.D.   On: 09/10/2013 07:54   Dg Chest Port 1 View  09/09/2013   CLINICAL DATA:  Pneumonia  EXAM: PORTABLE CHEST - 1 VIEW  COMPARISON:  CT CHEST W/O CM dated 09/22/2013; DG CHEST 1V PORT dated 09/07/2013; DG CHEST 1V PORT dated 09/12/2013  FINDINGS: Grossly unchanged enlarged cardiac silhouette and mediastinal contours given decreased lung volumes and patient rotation to the left. Stable position of support apparatus. The pulmonary vasculature remains indistinct with cephalization lobe. Grossly unchanged small bilateral effusions with associated bilateral mid and lower lung heterogeneous/consolidative opacities. Unchanged bones.  IMPRESSION: 1.  Stable positioning of support apparatus.  No pneumothorax. 2. Grossly unchanged findings of pulmonary edema, small bilateral effusions and associated bibasilar mid and lower lung opacities worrisome for multifocal infection as demonstrated on recent chest CT.   Electronically Signed   By: Simonne ComeJohn  Watts M.D.   On: 09/09/2013 08:02   Ct Image Guided Drainage By Percutaneous Catheter  09/09/2013   CLINICAL DATA:  Respiratory failure, pneumonia, loculated pleural effusion.  EXAM: CT GUIDED RIGHT PLEURAL DRAIN PLACEMENT  ANESTHESIA/SEDATION: Intravenous Fentanyl and Versed were administered as conscious sedation during continuous cardiorespiratory monitoring by the radiology RN, with a total moderate sedation time of 15 minutes.  PROCEDURE: The procedure, risks, benefits, and alternatives were explained to the family. Questions regarding the procedure were  encouraged and answered. The family understands and consents to the procedure.  Patient placed in left lateral decubitus positioning. Limited axial scans through the thorax obtained. An appropriate skin entry site was identified.  The right lateral chest wall was prepped with Betadinein a sterile fashion, and a sterile drape was applied covering the operative field. A sterile gown and sterile gloves were used for the procedure. Local anesthesia was provided with 1% Lidocaine.  Under CT fluoroscopic guidance, a 19 gauge percutaneous entry needle was advanced into  the right pleural space. Cloudy greenish 10 fluid returned. Amplatz wire advanced easily, its position confirmed on CT fluoroscopy. Tract was dilated to allow placement of a 14 French pigtail catheter, formed with in the dependent aspect of the effusion. CT confirms appropriate positioning. Approximately 10 mL of slightly cloudy amber fluid were aspirated, sent for routine Gram stain and culture. Catheter was secured externally with 0-Prolene suture and StatLock and placed to a Pleur-Evac at 20 cmH2O suction. The patient tolerated the procedure well.  COMPLICATIONS: None immediate  FINDINGS: Limited scans demonstrate a partially loculated bilateral pleural effusions. Airspace consolidation in both lower lobes with air bronchograms again evident. #14-French pigtail catheter placed in the loculated right pleural effusion.  IMPRESSION: 1. Technically successful CT-guided right pleural drain catheter placement. A sample of the aspirate sent for routine Gram stain and culture.   Electronically Signed   By: Oley Balm M.D.   On: 09/09/2013 17:28       ASSESSMENT / PLAN:  PULMONARY A: 1) VDRF likely due to bilateral CAP -> septic shock,      2) Acute/chronic systolic HF d/t NICM EF 15-20% Rt loculated effusion vs empyema s/p IR chest tube  P:   Full vent support TNK into chest tube vs VATs (poor candidate in my opinion) - will get TCTS  input   CARDIOVASCULAR A: 1) PAF with RVR - s/p emergent DC-CV at [facility] 2013-09-24      2) Acute/chronic systolic HF d/t NICM EF 15-20%      3) LBBB  4) [facility]side [facility] - ef 15% QTc  >538msec P:  Continue  amio and heparin.  Levophed for BP support as needed.  Diurese once off pressors. Co-ox ok-does not need inotropes   RENAL A: Making urine. At risk for ATN Hypokalmeia P Monitor, replete K   GI A NPO P Ct  tube feeds  ID A:  1) Septic shock       2) CAP - severe pneumococcal CAP + ? empyema       3) PCN and levofloxacin allergy   - DX made of pneumococcal CAP, severe with septic shock. MRSA PCR positive  P:   Abx as above Follow pl fluid cx Check resp virus PCR for ccompletion    ENDOCRINE  A:  1) h/o hypothyroidism - non compliant, TSH 23 P:     Resume sunthroid 150    DERM A: 1) Fungal rash P: Nystatin Diflucan 100 mg  X 7ds   CODE STATUS:  According to family patient often says she wishes she was dead and would not want life support. However daughter wants patient to remain FULL CODE.   GLOBAL - try to avoid VATs  Here given co-morbidities -get TCTS input re : TNK via chest tube Palliative care input for goals of care   The patient is critically ill with multiple organ systems failure and requires high complexity decision making for assessment and support, frequent evaluation and titration of therapies, application of advanced monitoring technologies and extensive interpretation of multiple databases.   Critical Care Time devoted to patient care services described in this note is  45 Minutes.  09/10/2013 9:32 AM

## 2013-09-10 NOTE — Consult Note (Addendum)
[address]       [place] [address]             [phone]          CARDIOTHORACIC SURGERY CONSULTATION REPORT  PCP is Bennie Pierini, FNP Referring Provider is Oretha Milch, MD   Reason for consultation:  Pleural effusion  HPI:  Patient is a 73 y/o obese white female smoker with PAF, CHF due to NICM EF 20%, LBBB, chronic pain syndrome, hypothyroidism and COPD with ongoing tobacco use admitted from [facility] on 09/02/2013 for respiratory failure due to bilateral PNA. According to her family she lives in her rcliner and only moves to get up to go to bedside commode. Has chronic pain and takes [employer] frequently but not taking any of her other meds. Has not seen a doctor in years. Continues to smoke 1-1.5 ppd. Has chronic cough. Over past week cough much worse and more dyspneic. Has had wife sputum and occasional blood tinged sputum. Occasion CP but unchanged. Daughter came to visit and patient was severely dyspneic in respiratory distress.  EMS was called and she was taken to [facility] where she was in rapid Afib, hypotensive (SBP 60s) and in extreme respiratory distress on arrival.  She was intubated in ER. CXR revealed bilateral infiltrates PNA vs CHF. Treated with Vancomycin and Levofloxacin. Underwent emergent DC-CV of AF. Started on neo without much benefit. Levophed added in ambulance and the patient was transferred to [facility] where a central line was placed.  Initial co-ox 71%. Bedside echo LVEF 15-20%. RV normal.  Labs notable for leukocytosis which has improved over last 48 hours.  CT chest w/out IV contrast notable for bilateral moderate-sized pleural effusions R>L with bilateral lower lobe airspace consolidation and atelectasis c/w pneumonia.  CT-guided placement of 14 Fr chest tube performed 5/18 by IR team which has drained > 1 liter green-tinged fluid that on analysis has low number WBC's and elevated LDH.  No organisms seen on Gram  stain, culture pending.  CT surgery consult has now been requested.  Of note, the patient was admitted in 2011 with respiratory failure and CHF in setting of AF with RVR. Echo Ef 25%. Underwent cath. Normal coronary arteries EF 40%. Underwent DC-CV. Placed on amio and Pradaxa. Last saw Dr. Antoine Poche in 2012 and was in NSR, dc'd from cardiology due to repeated no-shows.   Past Medical History  Diagnosis Date  . Atrial fibrillation with rapid ventricular response   . LBBB (left bundle branch block)     Chronic  . History of chest pain     a.  2011 Cath:  Minimal Coronary Plaque  . Hiatal hernia   . GERD (gastroesophageal reflux disease)   . Asthma   . Current tobacco use     Currently smoking 1 pack per day  . DJD (degenerative joint disease)   . Spinal stenosis     At L5-S1  . Chronic back pain   . Osteoarthritis   . Anxiety   . Hypertension   . Hyperlipidemia   . Colon polyps   . Hypothyroidism   . Fibromyalgia   . History of headache   . Cardiomyopathy 2011    a. Possibly related to Afib with RVR;  b. 12/2009 Echo: EF 20-25%, anteroseptal, apical, inferior AK. Mild MR. Mildly dilated LA.  Marland Kitchen Pleural effusion 09/10/2013    Bilateral, R>L   . Morbid obesity 09/09/2013  . History of noncompliance  with medical treatment, presenting hazards to health 09/09/2013  . NICM (nonischemic cardiomyopathy) - cath 2011 with minimal plaque 09/09/2013  . NSTEMI (non-ST elevated myocardial infarction) likely due to demand ischemia 09/09/2013  . Pneumococcal lobar pneumonia 2013/09/10  . Acute respiratory failure 09/09/2013  . Acute on chronic respiratory failure 09/10/2013  . Acute on chronic systolic heart failure Sep 10, 2013  . Septic shock(785.52) 09-10-13  . Paroxysmal atrial fibrillation 01/15/2010    Qualifier: Diagnosis of  By: Cristela Felt, CNA, Christy    . TOBACCO ABUSE 01/15/2010    Qualifier: Diagnosis of  By: Cristela Felt, CNA, Christy     . LBBB 01/15/2010    Qualifier: Diagnosis of  By: Cristela Felt, CNA,  Christy    . GERD 01/15/2010    Qualifier: Diagnosis of  By: Cristela Felt, CNA, Christy    . BACK PAIN, CHRONIC 01/15/2010    Qualifier: Diagnosis of  By: Cristela Felt, CNA, Christy    . ANXIETY 01/15/2010    Qualifier: Diagnosis of  By: Cristela Felt, CNA, Christy    . HYPERLIPIDEMIA 01/15/2010    Qualifier: Diagnosis of  By: Marciano Sequin      Past Surgical History  Procedure Laterality Date  . Tonsillectomy and adenoidectomy      As a child  . Tubal ligation    . Partial hysterectomy    . Bladder tack    . Rotator cuff repair      Right  . 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 Lumbar laminectomy/decompression microdiscectomy      Lumbar decompression  . Total knee arthroplasty  2002    Left  . Cervical discectomy      Details unknown    Family History  Problem Relation Age of Onset  . Osteoarthritis Other     family history  . Colon cancer Other     family history  . Diabetes Other     family history    History   Social History  . Marital Status: Married    Spouse Name: N/A    Number of Children: 2  . Years of Education: N/A   Occupational History  . Not on file.   Social History Main Topics  . Smoking status: Current Every Day Smoker  . Smokeless tobacco: Not on file     Comment: Denies tobacco use  . Alcohol Use: Yes     Comment: Rare  . Drug Use: Not on file  . Sexual Activity: Not on file   Other Topics Concern  . Not on file   Social History Narrative   Her husband will be available to help with her postoperative course. He works, but will take a could of days off after her surgery to help her upon returning home, and then will be able to help her as needed throughout the day if she calls him.    Prior to Admission medications   Medication Sig Start Date End Date Taking? Authorizing Provider  Aspirin-Acetaminophen-Caffeine (GOODY HEADACHE PO) Take 1 packet by mouth every 6 (six) hours.   Yes Historical Provider, MD  Cyanocobalamin (VITAMIN B-12  PO) Take 1 capsule by mouth daily.   Yes Historical Provider, MD  Fish Oil OIL Take 1 capsule by mouth daily.    Yes Historical Provider, MD  VITAMIN A PO Take 1 capsule by mouth daily.    Yes Historical Provider, MD  VITAMIN E PO Take 1 capsule by mouth daily.   Yes Historical Provider,  MD    Current Facility-Administered Medications  Medication Dose Route Frequency Provider Last Rate Last Dose  . 0.9 %  sodium chloride infusion   Intravenous Continuous Dolores Patty, MD 10 mL/hr at 09/10/13 1800 10 mL/hr at 09/10/13 1800  . amiodarone (NEXTERONE PREMIX) 360 MG/200ML (1.8 mg/mL) IV infusion  30 mg/hr Intravenous Continuous Bevelyn Buckles Bensimhon, MD 16.7 mL/hr at 09/10/13 1800 30 mg/hr at 09/10/13 1800  . antiseptic oral rinse (BIOTENE) solution 15 mL  15 mL Mouth Rinse QID Lonia Farber, MD   15 mL at 09/10/13 1600  . cefTRIAXone (ROCEPHIN) 2 g in dextrose 5 % 50 mL IVPB  2 g Intravenous Q24H Kalman Shan, MD   2 g at 09/10/13 1305  . chlorhexidine (PERIDEX) 0.12 % solution 15 mL  15 mL Mouth Rinse BID Lonia Farber, MD   15 mL at 09/10/13 0727  . docusate (COLACE) 50 MG/5ML liquid 100 mg  100 mg Oral Daily Oretha Milch, MD   100 mg at 09/10/13 1304  . feeding supplement (PRO-STAT SUGAR FREE 64) liquid 30 mL  30 mL Per Tube TID Ailene Ards, RD   30 mL at 09/10/13 1625  . feeding supplement (VITAL HIGH PROTEIN) liquid 1,000 mL  1,000 mL Per Tube Q24H Ailene Ards, RD   1,000 mL at 09/10/13 1200  . fentaNYL (SUBLIMAZE) 10 mcg/mL in sodium chloride 0.9 % 250 mL infusion  0-400 mcg/hr Intravenous Continuous Dolores Patty, MD 10 mL/hr at 09/10/13 1800 100 mcg/hr at 09/10/13 1800  . fentaNYL (SUBLIMAZE) bolus via infusion 25-50 mcg  25-50 mcg Intravenous Q1H PRN Dolores Patty, MD   25 mcg at 09/10/13 0033  . fentaNYL (SUBLIMAZE) injection 50 mcg  50 mcg Intravenous Q2H PRN Dolores Patty, MD   50 mcg at 09/04/2013 1610  . heparin ADULT  infusion 100 units/mL (25000 units/250 mL)  1,300 Units/hr Intravenous Continuous Lonia Farber, MD 13 mL/hr at 09/10/13 1800 1,300 Units/hr at 09/10/13 1800  . HYDROcodone-acetaminophen (NORCO/VICODIN) 5-325 MG per tablet 1-2 tablet  1-2 tablet Oral Q4H PRN Dayne Oley Balm III, MD      . insulin aspart (novoLOG) injection 2-6 Units  2-6 Units Subcutaneous 6 times per day Dolores Patty, MD   2 Units at 09/10/13 1626  . levothyroxine (SYNTHROID, LEVOTHROID) tablet 150 mcg  150 mcg Oral QAC breakfast Dolores Patty, MD   150 mcg at 09/10/13 0727  . midazolam (VERSED) injection 1 mg  1 mg Intravenous Q15 min PRN Nelda Bucks, MD   1 mg at 09/09/13 0446  . midazolam (VERSED) injection 1 mg  1 mg Intravenous Q2H PRN Nelda Bucks, MD   1 mg at 09/09/13 9604  . multivitamin liquid 5 mL  5 mL Per Tube Daily Ailene Ards, RD   5 mL at 09/10/13 1006  . norepinephrine (LEVOPHED) 8 mg in dextrose 5 % 250 mL infusion  2-50 mcg/min Intravenous Titrated Dolores Patty, MD 13.1 mL/hr at 09/10/13 1800 7 mcg/min at 09/10/13 1800  . nystatin cream (MYCOSTATIN)   Topical TID Dolores Patty, MD      . pantoprazole sodium (PROTONIX) 40 mg/20 mL oral suspension 40 mg  40 mg Per Tube Q1200 Oretha Milch, MD   40 mg at 09/10/13 1305  . pneumococcal 23 valent vaccine (PNU-IMMUNE) injection 0.5 mL  0.5 mL Intramuscular Tomorrow-1000 Kalman Shan, MD      . sodium chloride 0.9 % injection 10-40  mL  10-40 mL Intracatheter Q12H Willene HatchetStephanie Nicole Dixon, RN      . sodium chloride 0.9 % injection 10-40 mL  10-40 mL Intracatheter PRN Willene HatchetStephanie Nicole Dixon, RN      . vancomycin (VANCOCIN) IVPB 750 mg/150 ml premix  750 mg Intravenous Q12H Lonia FarberKonstantin Zubelevitskiy, MD   750 mg at 09/10/13 [id number]    Allergies  Allergen Reactions  . Aspirin     Pt intubated (08/2013) and unable to verbalize reaction, but affirms allergy (so does family)  . Codeine     REACTION: itch with large  doses  . Iodine Itching    Felt hot  . Levofloxacin     REACTION: itch  . Meperidine Hcl     REACTION: nausea  . Methadone     REACTION: ? confusion  . Penicillins     REACTION: hives  . Sulfonamide Derivatives     REACTION: nausea,vomiting,itching      Review of Systems:  Patient is lightly sedated on ventilator. She is arousable to verbal stimuli and follow some simple commands. She denies pain. Complete review of systems cannot be obtained under the current circumstances.     Physical Exam:   BP 118/96  Pulse 72  Temp(Src) 97.2 F (36.2 C) (Axillary)  Resp 22  Ht 5' (1.524 m)  Wt 108.2 kg (238 lb 8.6 oz)  BMI 46.59 kg/m2  SpO2 100%  General:  Chronically ill-appearing  HEENT:  Unremarkable   Neck:   no JVD, no bruits, no adenopathy   Chest:   coarse breath sounds, no wheezes, no rhonchi   CV:   RRR, no murmur   Abdomen:  soft, non-tender, no masses   Extremities:  warm, well-perfused, pulses not palpable, mild lower extremity edema  Rectal/GU  Deferred  Neuro:   Grossly non-focal and symmetrical throughout  Skin:   Clean and dry, no rashes, no breakdown  Diagnostic Tests:  CT CHEST WITHOUT CONTRAST  TECHNIQUE:  Multidetector CT imaging of the chest was performed following the  standard protocol without IV contrast.  COMPARISON: 2014-04-24  FINDINGS:  The heart size is moderately enlarged. There is no pericardial  effusion. Calcified atherosclerotic disease involves the thoracic  aorta as well as the LAD and RCA coronary arteries. Prominent  mediastinal lymph nodes are identified in may be reactive. There is  no axillary or supraclavicular adenopathy.  Moderate volume left pleural effusion is identified. A moderate  volume of loculated right pleural effusion is also identified. This  may represent an empyema. Dense airspace consolidation containing  air bronchograms is identified within both lower lobes as well as  the right middle lobe. Patchy areas of  ground-glass attenuation and  airspace consolidation is identified within both upper lobes.  A small amount of perihepatic ascites is noted in the upper abdomen.  IMPRESSION:  1. Moderate volume, loculated right pleural effusion which may  represent empyema.  2. Moderate simple appearing left pleural effusion.  3. Bilateral lower lobe airspace consolidation and atelectasis with  patchy areas of pneumonitis within both upper lobes. Findings are  compatible with multifocal pneumonia.  4. Atherosclerotic disease including multi vessel coronary artery  calcifications.  5. Ascites.  Electronically Signed  By: Signa Kellaylor Stroud M.D.  On: 2014-04-24 16:42   PORTABLE CHEST - 1 VIEW  COMPARISON: 09/09/2013 and earlier.  FINDINGS:  Portable AP semi upright view at 0610 hrs. New right pigtail pleural  catheter in place. Density at the right lung base not significantly  changed. No  pneumothorax.  Stable endotracheal tube tip at the level the clavicles. Stable  right IJ approach central line tip at the level of the cavoatrial  junction. Stable visualized enteric tube.  The patient is now more rotated to the left. Veiling opacity at the  left lung base is stable.  IMPRESSION:  1. Right pigtail pleural catheter placed. No pneumothorax. Density  at the right lung base not significantly changed.  2. Otherwise, stable lines and tubes.  3. Stable left lung base density representing a combination of  pleural fluid and consolidation, as seen on CT 09/15/2013.  Electronically Signed  By: Augusto Gamble M.D.  On: 09/10/2013 07:54    Gram Stain MODERATE WBC PRESENT, PREDOMINANTLY PMN NO ORGANISMS SEEN Performed at [facility] Culture NO GROWTH 1 DAY Performed at [facility] Report Status PENDING   Results for Cathy Robles, Cathy Robles (MRN [id number]) as of 09/10/2013 18:20  Ref. Range 09/10/2013 14:49  Monocyte-Macrophage-Serous Fluid Latest Range: 50-90 % 7 (L)  Fluid Type-FLDH No range found  FLUID  LD, Fluid Latest Range: 3-23 U/L 1174 (H)  Total protein, fluid No range found 3.1  Fluid Type-FCT No range found FLUID  Fluid Type-FTP No range found FLUID  Color, Fluid Latest Range: YELLOW  AMBER (A)  WBC, Fluid Latest Range: 0-1000 cu mm 113  Lymphs, Fluid No range found 18  Eos, Fluid No range found 0  Appearance, Fluid Latest Range: CLEAR  CLEAR  Neutrophil Count, Fluid Latest Range: 0-25 % 75 (H)    Results for Cathy Robles, Cathy Robles (MRN [id number]) as of 09/10/2013 18:20  Ref. Range 09/11/2013 03:00 09/11/2013 05:00 09/09/2013 04:00 09/10/2013 04:44  WBC Latest Range: 4.0-10.5 K/uL 22.6 (H) 21.6 (H) 20.1 (H) 16.3 (H)  RBC Latest Range: 3.87-5.11 MIL/uL 3.94 4.12 4.10 3.86 (L)  Hemoglobin Latest Range: 12.0-15.0 g/dL 78.2 (L) 95.6 (L) 21.3 (L) 11.2 (L)  HCT Latest Range: 36.0-46.0 % 36.6 38.0 37.3 35.8 (L)  MCV Latest Range: 78.0-100.0 fL 92.9 92.2 91.0 92.7  MCH Latest Range: 26.0-34.0 pg 29.2 28.6 29.0 29.0  MCHC Latest Range: 30.0-36.0 g/dL 08.6 57.8 46.9 62.9  RDW Latest Range: 11.5-15.5 % 17.1 (H) 17.2 (H) 17.0 (H) 17.5 (H)  Platelets Latest Range: 150-400 K/uL 144 (L) 128 (L) 121 (L) 106 (L)    Impression:  Patient is a 73 year old morbidly obese female with numerous severe comorbid medical conditions who presents with what appears to be pneumococcal pneumonia complicated by acute on chronic respiratory failure.  CT scan of the chest performed without IV contrast shortly after admission revealed bilateral lower lobe pneumonia with bilateral pleural effusions, much greater on the right side than the left. A right chest tube was successfully placed yesterday by the interventional radiology team and drained more than 1 liter of fluid.  Followup chest x-ray demonstrates what appears to be reasonably good evacuation of the right pleural effusion.  There remains a small to moderate sized pleural effusion on the left with severe bilateral lower lobe opacity consistent with pneumonia.   Clinically the patient seems to be improving. At this point I am skeptical that thoracoscopy would provide significant additional benefit, and risks associated with surgery would be considerable.  However, repeat CT scan could be considered to reevaluate the amount of residual pleural fluid.   Plan:  Consider repeat CT scan of the chest if the patient does not continue to improve clinically. I would not recommend surgery at this time.    I spent in excess of 60  minutes during the conduct of this hospital consultation and >50% of this time involved direct face-to-face encounter for counseling and/or coordination of the patient's care.   Salvatore Decent. Cornelius Moras, MD 09/10/2013 6:14 PM    ADDENDUM:  Plans for Palliative Care consult noted this morning.  Please call if we can be of further assistance.  Purcell Nails 09/11/2013 8:16 AM

## 2013-09-10 NOTE — Progress Notes (Signed)
Subjective: Rt empyema Rt chest tube drain placed 5/18 Better today per dtr Still on vent  Objective: Vital signs in last 24 hours: Temp:  [96.6 F (35.9 C)-98 F (36.7 C)] 97.2 F (36.2 C) (05/19 0814) Pulse Rate:  [63-73] 69 (05/19 0800) Resp:  [11-23] 22 (05/19 0800) BP: (83-117)/(36-91) 100/73 mmHg (05/19 0800) SpO2:  [90 %-100 %] 90 % (05/19 0800) FiO2 (%):  [50 %-100 %] 50 % (05/19 0344) Weight:  [108.2 kg (238 lb 8.6 oz)] 108.2 kg (238 lb 8.6 oz) (05/19 0446)    Intake/Output from previous day: 05/18 0701 - 05/19 0700 In: 2270.8 [I.V.:1362.1; NG/GT:558.8; IV Piggyback:350] Out: 2160 [Urine:1100; Emesis/NG output:50; Chest Tube:1010] Intake/Output this shift: Total I/O In: 249.2 [I.V.:64.2; NG/GT:35; IV Piggyback:150] Out: 110 [Urine:100; Chest Tube:10]  PE: afeb; vss Chest tube in place Site clean and dry; NT Output great Over 1 liter; blood tinged serous fluid in pleurvac CXR today- no real change Wbc 16.3 (20.1) cx no growth so far  Lab Results:   Recent Labs  09/09/13 0400 09/10/13 0444  WBC 20.1* 16.3*  HGB 11.9* 11.2*  HCT 37.3 35.8*  PLT 121* 106*   BMET  Recent Labs  09/09/13 0400 09/10/13 0444  NA 133* 136*  K 3.6* 3.3*  CL 97 100  CO2 20 24  GLUCOSE 104* 131*  BUN 42* 30*  CREATININE 0.93 0.73  CALCIUM 7.8* 7.5*   PT/INR  Recent Labs  09/09/13 0400 09/10/13 0444  LABPROT 18.8* 18.0*  INR 1.62* 1.53*   ABG  Recent Labs  Sep 29, 2013 0946 09/09/13 0401  PHART 7.352 7.357  HCO3 20.7 20.7    Studies/Results: Ct Chest Wo Contrast  09/29/2013   CLINICAL DATA:  Evaluate empyema.  EXAM: CT CHEST WITHOUT CONTRAST  TECHNIQUE: Multidetector CT imaging of the chest was performed following the standard protocol without IV contrast.  COMPARISON:  09-29-13  FINDINGS: The heart size is moderately enlarged. There is no pericardial effusion. Calcified atherosclerotic disease involves the thoracic aorta as well as the LAD and RCA  coronary arteries. Prominent mediastinal lymph nodes are identified in may be reactive. There is no axillary or supraclavicular adenopathy.  Moderate volume left pleural effusion is identified. A moderate volume of loculated right pleural effusion is also identified. This may represent an empyema. Dense airspace consolidation containing air bronchograms is identified within both lower lobes as well as the right middle lobe. Patchy areas of ground-glass attenuation and airspace consolidation is identified within both upper lobes.  A small amount of perihepatic ascites is noted in the upper abdomen.  IMPRESSION: 1. Moderate volume, loculated right pleural effusion which may represent empyema. 2. Moderate simple appearing left pleural effusion. 3. Bilateral lower lobe airspace consolidation and atelectasis with patchy areas of pneumonitis within both upper lobes. Findings are compatible with multifocal pneumonia. 4. Atherosclerotic disease including multi vessel coronary artery calcifications. 5. Ascites.   Electronically Signed   By: Signa Kell M.D.   On: 29-Sep-2013 16:42   Dg Chest Port 1 View  09/10/2013   CLINICAL DATA:  73 year old female with pleural effusion. Lower lobe consolidation. Acute respiratory failure. Initial encounter.  EXAM: PORTABLE CHEST - 1 VIEW  COMPARISON:  09/09/2013 and earlier.  FINDINGS: Portable AP semi upright view at 0610 hrs. New right pigtail pleural catheter in place. Density at the right lung base not significantly changed. No pneumothorax.  Stable endotracheal tube tip at the level the clavicles. Stable right IJ approach central line tip at the level of  the cavoatrial junction. Stable visualized enteric tube.  The patient is now more rotated to the left. Veiling opacity at the left lung base is stable.  IMPRESSION: 1. Right pigtail pleural catheter placed. No pneumothorax. Density at the right lung base not significantly changed. 2.  Otherwise, stable lines and tubes. 3.  Stable left lung base density representing a combination of pleural fluid and consolidation, as seen on CT 09/07/2013.   Electronically Signed   By: Augusto Gamble M.D.   On: 09/10/2013 07:54   Dg Chest Port 1 View  09/09/2013   CLINICAL DATA:  Pneumonia  EXAM: PORTABLE CHEST - 1 VIEW  COMPARISON:  CT CHEST W/O CM dated 09/11/2013; DG CHEST 1V PORT dated 08/25/2013; DG CHEST 1V PORT dated 09/12/2013  FINDINGS: Grossly unchanged enlarged cardiac silhouette and mediastinal contours given decreased lung volumes and patient rotation to the left. Stable position of support apparatus. The pulmonary vasculature remains indistinct with cephalization lobe. Grossly unchanged small bilateral effusions with associated bilateral mid and lower lung heterogeneous/consolidative opacities. Unchanged bones.  IMPRESSION: 1.  Stable positioning of support apparatus.  No pneumothorax. 2. Grossly unchanged findings of pulmonary edema, small bilateral effusions and associated bibasilar mid and lower lung opacities worrisome for multifocal infection as demonstrated on recent chest CT.   Electronically Signed   By: Simonne Come M.D.   On: 09/09/2013 08:02   Ct Image Guided Drainage By Percutaneous Catheter  09/09/2013   CLINICAL DATA:  Respiratory failure, pneumonia, loculated pleural effusion.  EXAM: CT GUIDED RIGHT PLEURAL DRAIN PLACEMENT  ANESTHESIA/SEDATION: Intravenous Fentanyl and Versed were administered as conscious sedation during continuous cardiorespiratory monitoring by the radiology RN, with a total moderate sedation time of 15 minutes.  PROCEDURE: The procedure, risks, benefits, and alternatives were explained to the family. Questions regarding the procedure were encouraged and answered. The family understands and consents to the procedure.  Patient placed in left lateral decubitus positioning. Limited axial scans through the thorax obtained. An appropriate skin entry site was identified.  The right lateral chest wall was prepped  with Betadinein a sterile fashion, and a sterile drape was applied covering the operative field. A sterile gown and sterile gloves were used for the procedure. Local anesthesia was provided with 1% Lidocaine.  Under CT fluoroscopic guidance, a 19 gauge percutaneous entry needle was advanced into the right pleural space. Cloudy greenish 10 fluid returned. Amplatz wire advanced easily, its position confirmed on CT fluoroscopy. Tract was dilated to allow placement of a 14 French pigtail catheter, formed with in the dependent aspect of the effusion. CT confirms appropriate positioning. Approximately 10 mL of slightly cloudy amber fluid were aspirated, sent for routine Gram stain and culture. Catheter was secured externally with 0-Prolene suture and StatLock and placed to a Pleur-Evac at 20 cmH2O suction. The patient tolerated the procedure well.  COMPLICATIONS: None immediate  FINDINGS: Limited scans demonstrate a partially loculated bilateral pleural effusions. Airspace consolidation in both lower lobes with air bronchograms again evident. #14-French pigtail catheter placed in the loculated right pleural effusion.  IMPRESSION: 1. Technically successful CT-guided right pleural drain catheter placement. A sample of the aspirate sent for routine Gram stain and culture.   Electronically Signed   By: Oley Balm M.D.   On: 09/09/2013 17:28    Anti-infectives: Anti-infectives   Start     Dose/Rate Route Frequency Ordered Stop   08/29/2013 1800  ceFEPIme (MAXIPIME) 1 g in dextrose 5 % 50 mL IVPB  Status:  Discontinued  1 g 100 mL/hr over 30 Minutes Intravenous Every 12 hours 27-Jun-2013 0623 27-Jun-2013 1334   27-Jun-2013 1400  cefTRIAXone (ROCEPHIN) 2 g in dextrose 5 % 50 mL IVPB     2 g 100 mL/hr over 30 Minutes Intravenous Every 24 hours 27-Jun-2013 1334     27-Jun-2013 1400  azithromycin (ZITHROMAX) 500 mg in dextrose 5 % 250 mL IVPB  Status:  Discontinued     500 mg 250 mL/hr over 60 Minutes Intravenous Every 24  hours 27-Jun-2013 1334 27-Jun-2013 1349   27-Jun-2013 0800  vancomycin (VANCOCIN) IVPB 750 mg/150 ml premix     750 mg 150 mL/hr over 60 Minutes Intravenous Every 12 hours 27-Jun-2013 0623     27-Jun-2013 0330  ceFEPIme (MAXIPIME) 2 g in dextrose 5 % 50 mL IVPB     2 g 100 mL/hr over 30 Minutes Intravenous  Once 27-Jun-2013 0321 27-Jun-2013 0415      Assessment/Plan: s/p * No surgery found *  Rt empyema chest tube drain placed 5/18 Some better Still on vent Will follow   LOS: 2 days    Robet Leuamela A Axiel Fjeld 09/10/2013

## 2013-09-11 ENCOUNTER — Inpatient Hospital Stay (HOSPITAL_COMMUNITY): Payer: Managed Care, Other (non HMO)

## 2013-09-11 DIAGNOSIS — J869 Pyothorax without fistula: Secondary | ICD-10-CM

## 2013-09-11 DIAGNOSIS — R5383 Other fatigue: Secondary | ICD-10-CM

## 2013-09-11 DIAGNOSIS — R531 Weakness: Secondary | ICD-10-CM

## 2013-09-11 DIAGNOSIS — Z515 Encounter for palliative care: Secondary | ICD-10-CM

## 2013-09-11 DIAGNOSIS — R5381 Other malaise: Secondary | ICD-10-CM

## 2013-09-11 LAB — HEPATIC FUNCTION PANEL
ALBUMIN: 1.7 g/dL — AB (ref 3.5–5.2)
ALK PHOS: 92 U/L (ref 39–117)
ALT: 168 U/L — AB (ref 0–35)
AST: 106 U/L — ABNORMAL HIGH (ref 0–37)
Bilirubin, Direct: 0.3 mg/dL (ref 0.0–0.3)
Indirect Bilirubin: 0.2 mg/dL — ABNORMAL LOW (ref 0.3–0.9)
Total Bilirubin: 0.5 mg/dL (ref 0.3–1.2)
Total Protein: 5.1 g/dL — ABNORMAL LOW (ref 6.0–8.3)

## 2013-09-11 LAB — GLUCOSE, CAPILLARY
GLUCOSE-CAPILLARY: 111 mg/dL — AB (ref 70–99)
GLUCOSE-CAPILLARY: 124 mg/dL — AB (ref 70–99)
GLUCOSE-CAPILLARY: 128 mg/dL — AB (ref 70–99)
Glucose-Capillary: 125 mg/dL — ABNORMAL HIGH (ref 70–99)
Glucose-Capillary: 112 mg/dL — ABNORMAL HIGH (ref 70–99)

## 2013-09-11 LAB — BASIC METABOLIC PANEL
BUN: 26 mg/dL — ABNORMAL HIGH (ref 6–23)
CALCIUM: 7.2 mg/dL — AB (ref 8.4–10.5)
CO2: 26 meq/L (ref 19–32)
CREATININE: 0.66 mg/dL (ref 0.50–1.10)
Chloride: 100 mEq/L (ref 96–112)
GFR calc Af Amer: >90 mL/min (ref >90)
GFR, EST NON AFRICAN AMERICAN: 87 mL/min — AB (ref >90)
GLUCOSE: 119 mg/dL — AB (ref 70–99)
Potassium: 3.4 mEq/L — ABNORMAL LOW (ref 3.7–5.3)
Sodium: 136 mEq/L — ABNORMAL LOW (ref 137–147)

## 2013-09-11 LAB — CBC
HCT: 33.7 % — ABNORMAL LOW (ref 36.0–46.0)
Hemoglobin: 10.4 g/dL — ABNORMAL LOW (ref 12.0–15.0)
MCH: 28.9 pg (ref 26.0–34.0)
MCHC: 30.9 g/dL (ref 30.0–36.0)
MCV: 93.6 fL (ref 78.0–100.0)
PLATELETS: 92 10*3/uL — AB (ref 150–400)
RBC: 3.6 MIL/uL — ABNORMAL LOW (ref 3.87–5.11)
RDW: 17.7 % — AB (ref 11.5–15.5)
WBC: 14.3 10*3/uL — ABNORMAL HIGH (ref 4.0–10.5)

## 2013-09-11 LAB — CLOSTRIDIUM DIFFICILE BY PCR: Toxigenic C. Difficile by PCR: POSITIVE — AB

## 2013-09-11 LAB — CARBOXYHEMOGLOBIN
Carboxyhemoglobin: 0.7 % (ref 0.5–1.5)
Methemoglobin: 1.4 % (ref 0.0–1.5)
O2 Saturation: 65.4 %
TOTAL HEMOGLOBIN: 10.7 g/dL — AB (ref 12.0–16.0)

## 2013-09-11 LAB — MAGNESIUM: Magnesium: 1.6 mg/dL (ref 1.5–2.5)

## 2013-09-11 LAB — PATHOLOGIST SMEAR REVIEW

## 2013-09-11 LAB — HEPARIN LEVEL (UNFRACTIONATED): Heparin Unfractionated: 0.27 IU/mL — ABNORMAL LOW (ref 0.30–0.70)

## 2013-09-11 LAB — PHOSPHORUS: Phosphorus: 2.8 mg/dL (ref 2.3–4.6)

## 2013-09-11 MED ORDER — ALTEPLASE 100 MG IV SOLR
10.0000 mg | Freq: Once | INTRAVENOUS | Status: DC
  Filled 2013-09-11: qty 10

## 2013-09-11 MED ORDER — DORNASE ALFA 2.5 MG/2.5ML IN SOLN
5.0000 mg | Freq: Once | RESPIRATORY_TRACT | Status: AC
  Administered 2013-09-11: 5 mg via RESPIRATORY_TRACT
  Filled 2013-09-11: qty 5

## 2013-09-11 MED ORDER — FENTANYL CITRATE 0.05 MG/ML IJ SOLN: 25.0000 ug | INTRAMUSCULAR | Status: DC | PRN

## 2013-09-11 MED ORDER — AMIODARONE HCL 200 MG PO TABS
400.0000 mg | ORAL_TABLET | Freq: Every day | ORAL | Status: DC
  Administered 2013-09-11 – 2013-09-12 (×2): 400 mg
  Filled 2013-09-11 (×3): qty 2

## 2013-09-11 MED ORDER — AMIODARONE PEDIATRIC ORAL SUSPENSION 5 MG/ML
400.0000 mg | Freq: Every day | ORAL | Status: DC
  Filled 2013-09-11 (×2): qty 80

## 2013-09-11 MED ORDER — VANCOMYCIN 50 MG/ML ORAL SOLUTION
125.0000 mg | Freq: Four times a day (QID) | ORAL | Status: DC
  Administered 2013-09-11 – 2013-09-12 (×5): 125 mg via ORAL
  Filled 2013-09-11 (×9): qty 2.5

## 2013-09-11 MED ORDER — ENOXAPARIN SODIUM 120 MG/0.8ML ~~LOC~~ SOLN
105.0000 mg | Freq: Two times a day (BID) | SUBCUTANEOUS | Status: DC
  Administered 2013-09-11 – 2013-09-13 (×5): 105 mg via SUBCUTANEOUS
  Filled 2013-09-11 (×6): qty 0.8

## 2013-09-11 MED ORDER — METRONIDAZOLE 500 MG PO TABS
500.0000 mg | ORAL_TABLET | Freq: Three times a day (TID) | ORAL | Status: DC
  Administered 2013-09-11 – 2013-09-12 (×4): 500 mg via ORAL
  Filled 2013-09-11 (×6): qty 1

## 2013-09-11 NOTE — Progress Notes (Addendum)
PULMONARY  / CRITICAL CARE MEDICINE  Name: Cathy Robles MRN: [id number] DOB: [birth year] PCP Bennie Pierini, FNP   ADMISSION DATE:  09/13/2013 LOS 3 days  REFERRING MD :  [facility] PRIMARY SERVICE: CCM  CHIEF COMPLAINT:  PNA/Respiratory failure  BRIEF PATIENT DESCRIPTION:    73 y/o obese 1.5 PPD smoker  with PAF, CHF due to NICM EF 20%, LBBB, chronic pain syndrome, hypothyroidism and COPD with ongoing tobacco use admitted from [facility] on 08/24/2013 for respiratory failure due to bilateral PNA.   Admitted in 2011 with respiratory failure and CHF in setting of AF with RVR. Echo Ef 25%. Underwent cath. Normal coronary arteries EF 40%. Underwent DC-CV. Placed on amio and Pradaxa. Last saw Dr. Antoine Poche in 2012 and was in NSR, dc'd from cardiology due to repeated no-shows.  Daughter came to visit 5/17 and was severely dyspneic and called 911. Taken to [facility]. Was hypotensive (SBP 60s) and in extreme respiratory distress as well as AF with RVR.  Intubated in ER. CXR with bilateral infiltrates PNA vs CHF. Treated with Vancomycin and Levofloxacin. Underwent emergent DC-CV of AF. Started on neo without much benefit. Levophed added in ambulance.   On arrival, awake and following commands. Central line placed with co-ox 71%. Bedside echo LVEF 15-20%. RV normal.   Labs from [place]. WBC 23K. BNP 1110. Trop normal. UA negative  LINES / TUBES: RIJ CVL 09/11/2013 >>> R radial arterial line 09/12/2013>>> ETT 09/21/2013 [facility]) >>> Rt chest tube (IR) 5/18 >>  CULTURES: BCX x 2 (09/14/2013) >>>ng Sputum CX >>neg UA (09/20/2013 >> negative at [facility] ....... Urine strep - POSITIVE Urine leg neg resp virus pcr tracheal aspirate  Pl fluid rt >> ng C diff pCR 5/19 POS  ANTIBIOTICS: Vancomycin 09/20/2013 >>> Cefipime 08/29/13 >>>5/17, CEftriaxone 5/17 >> PO vanc 5/20 >> PO flagyl >>  Levofloxacin x 1 at [facility] 08/29/13 >>> d/c'd due to reported allergy  PCN  alllergy (hives) on chart   SIGNIFICANT EVENTS / STUDIES:  Intubated 09/05/2013 DC-CV of AF at [facility] 08/31/2013 Rt chest tube (IR) 5/18 Duplex LUE neg 5/18    SUBJECTIVE/OVERNIGHT/INTERVAL HX Remains on levophed , in nSR Afebrile Not in pain, int agitation   VITAL SIGNS: Filed Vitals:   09/11/13 0500 09/11/13 0600 09/11/13 0700 09/11/13 0800  BP: 101/74 80/61 102/59 89/57  Pulse: 65  65 68  Temp:   98.1 F (36.7 C)   TempSrc:   Oral   Resp: 18 18 22 22   Height:      Weight:      SpO2: 100% 100% 100% 100%      HEMODYNAMICS:   VENTILATOR SETTINGS: Vent Mode:  [-] PRVC FiO2 (%):  [30 %] 30 % Set Rate:  [28 bmp] 28 bmp Vt Set:  [420 mL] 420 mL PEEP:  [5 cmH20] 5 cmH20 Plateau Pressure:  [4 cmH20-21 cmH20] 20 cmH20 INTAKE / OUTPUT: I/O last 3 completed shifts: In: 4065.2 [I.V.:2260.2; NG/GT:1305; IV Piggyback:500] Out: 2085 [Urine:2025; Chest Tube:60]   PHYSICAL EXAMINATION: General:  Chronically and critically ill appearing.  HEENT: normal except for poor dentition and ETT Neck: supple. Thick.noJVD. Carotids no bruits. No lymphadenopathy or thryomegaly appreciated. Cor: PMI nonpalpable. Regular rate & rhythm. Distant HS No obvious rubs, gallops or murmurs. Fungal rash on chest Lungs: + rhonchi, rt chest tube serosanguinous fluid Abdomen: obese soft, nontender, nondistended. No hepatosplenomegaly. No bruits or masses. Good bowel sounds. Extremities: no cyanosis, clubbing, rash. Cool trace edema Neuro: . Follows commands. Nonfocal, RASS -  1  LABS: PULMONARY  Recent Labs Lab 12/31/13 0354 12/31/13 0946 09/09/13 0401 09/10/13 0448 09/11/13 0500  PHART 7.191* 7.352 7.357  --   --   PCO2ART 47.1* 36.9 37.8  --   --   PO2ART 125.0* 144.0* 87.2  --   --   HCO3 17.4* 20.7 20.7  --   --   TCO2 18.8 22 21.8  --   --   O2SAT 96.1 99.0 67.0  94.9 65.0 65.4    CBC  Recent Labs Lab 09/09/13 0400 09/10/13 0444 09/11/13 0420  HGB 11.9* 11.2* 10.4*  HCT  37.3 35.8* 33.7*  WBC 20.1* 16.3* 14.3*  PLT 121* 106* 92*    COAGULATION  Recent Labs Lab 09/09/13 0400 09/10/13 0444  INR 1.62* 1.53*    CARDIAC    Recent Labs Lab 12/31/13 0309 12/31/13 0907 12/31/13 1940 09/09/13 0400  TROPONINI <0.30 0.70* 0.44* 0.31*    Recent Labs Lab 12/31/13 0343 09/09/13 0400  PROBNP 10714.0* 6702.0*     CHEMISTRY  Recent Labs Lab 12/31/13 0500 12/31/13 1435 09/09/13 0400 09/10/13 0444 09/11/13 0420  NA 132* 134* 133* 136* 136*  K 4.7 4.1 3.6* 3.3* 3.4*  CL 100 99 97 100 100  CO2 17* 19 20 24 26   GLUCOSE 166* 120* 104* 131* 119*  BUN 49* 50* 42* 30* 26*  CREATININE 1.04 1.07 0.93 0.73 0.66  CALCIUM 7.5* 7.7* 7.8* 7.5* 7.2*  MG  --   --  1.9 1.7 1.6  PHOS  --   --  2.6 2.9 2.8   Estimated Creatinine Clearance: 72.7 ml/min (by C-G formula based on Cr of 0.66).   LIVER  Recent Labs Lab 12/31/13 0300 09/09/13 0400 09/10/13 0444 09/11/13 0420  AST 678* 266* 147* 106*  ALT 495* 369* 241* 168*  ALKPHOS 89 91 105 92  BILITOT 1.2 0.7 0.5 0.5  PROT 5.1* 5.2* 5.0* 5.1*  ALBUMIN 2.1* 2.1* 1.9* 1.7*  INR  --  1.62* 1.53*  --      INFECTIOUS  Recent Labs Lab 12/31/13 0308  12/31/13 1320 09/09/13 0400 09/10/13 0444  LATICACIDVEN 2.5*  --   --  1.4  --   PROCALCITON  --   < > 1.34 1.07 0.52  < > = values in this interval not displayed.   ENDOCRINE CBG (last 3)   Recent Labs  09/10/13 2336 09/11/13 0357 09/11/13 0735  GLUCAP 128* 111* 124*      IMAGING x48h  Dg Chest Port 1 View  09/10/2013   CLINICAL DATA:  73 year old female with pleural effusion. Lower lobe consolidation. Acute respiratory failure. Initial encounter.  EXAM: PORTABLE CHEST - 1 VIEW  COMPARISON:  09/09/2013 and earlier.  FINDINGS: Portable AP semi upright view at 0610 hrs. New right pigtail pleural catheter in place. Density at the right lung base not significantly changed. No pneumothorax.  Stable endotracheal tube tip at the level the  clavicles. Stable right IJ approach central line tip at the level of the cavoatrial junction. Stable visualized enteric tube.  The patient is now more rotated to the left. Veiling opacity at the left lung base is stable.  IMPRESSION: 1. Right pigtail pleural catheter placed. No pneumothorax. Density at the right lung base not significantly changed. 2.  Otherwise, stable lines and tubes. 3. Stable left lung base density representing a combination of pleural fluid and consolidation, as seen on CT December 12, 2013.   Electronically Signed   By: Augusto GambleLee  Hall M.D.   On: 09/10/2013  07:54   Ct Image Guided Drainage By Percutaneous Catheter  09/09/2013   CLINICAL DATA:  Respiratory failure, pneumonia, loculated pleural effusion.  EXAM: CT GUIDED RIGHT PLEURAL DRAIN PLACEMENT  ANESTHESIA/SEDATION: Intravenous Fentanyl and Versed were administered as conscious sedation during continuous cardiorespiratory monitoring by the radiology RN, with a total moderate sedation time of 15 minutes.  PROCEDURE: The procedure, risks, benefits, and alternatives were explained to the family. Questions regarding the procedure were encouraged and answered. The family understands and consents to the procedure.  Patient placed in left lateral decubitus positioning. Limited axial scans through the thorax obtained. An appropriate skin entry site was identified.  The right lateral chest wall was prepped with Betadinein a sterile fashion, and a sterile drape was applied covering the operative field. A sterile gown and sterile gloves were used for the procedure. Local anesthesia was provided with 1% Lidocaine.  Under CT fluoroscopic guidance, a 19 gauge percutaneous entry needle was advanced into the right pleural space. Cloudy greenish 10 fluid returned. Amplatz wire advanced easily, its position confirmed on CT fluoroscopy. Tract was dilated to allow placement of a 14 French pigtail catheter, formed with in the dependent aspect of the effusion. CT  confirms appropriate positioning. Approximately 10 mL of slightly cloudy amber fluid were aspirated, sent for routine Gram stain and culture. Catheter was secured externally with 0-Prolene suture and StatLock and placed to a Pleur-Evac at 20 cmH2O suction. The patient tolerated the procedure well.  COMPLICATIONS: None immediate  FINDINGS: Limited scans demonstrate a partially loculated bilateral pleural effusions. Airspace consolidation in both lower lobes with air bronchograms again evident. #14-French pigtail catheter placed in the loculated right pleural effusion.  IMPRESSION: 1. Technically successful CT-guided right pleural drain catheter placement. A sample of the aspirate sent for routine Gram stain and culture.   Electronically Signed   By: Oley Balm M.D.   On: 09/09/2013 17:28       ASSESSMENT / PLAN:  PULMONARY A: 1) VDRF likely due to severe pneumococal CAP -> septic shock,      2) Acute/chronic systolic HF d/t NICM EF 15-20% Rt loculated effusion vs empyema s/p IR chest tube -VATs (poor candidate in my opinion) - appreciate TCTS input  P:   SBTs  Consider TNK into chest tube -may need rpt imaging   CARDIOVASCULAR A: 1) PAF with RVR - s/p emergent DC-CV at [facility] 2013-09-15      2) Acute/chronic systolic HF d/t NICM EF 15-20%      3) LBBB  4) [facility]side [facility] - ef 15% QTc on adm >564msec P:  Continue  amio via tube Change to lovenox.  Levophed for BP support as needed.  Diurese once off pressors. Co-ox ok-does not need inotropes   RENAL A: Making urine. At risk for ATN Hypokalmeia P Monitor, replete K   GI A NPO P Ct  tube feeds  ID A:  1) Septic shock       2) CAP - severe pneumococcal CAP + Rt empyema       3) PCN and levofloxacin allergy  -MRSA PCR positive C diff POS  P:   Abx as above Dc vanc Check resp virus PCR for completion Start flagyl & vanc via tube  Heme - plts drop from 144k on adm Stop heparin if < 70k   ENDOCRINE  A:   1) h/o hypothyroidism - non compliant, TSH 23 P:     Resume synthroid 150    DERM A: 1)  Fungal rash P: Nystatin Diflucan 100 mg  X 7ds   CODE STATUS:  According to family patient often says she wishes she was dead and would not want life support. However daughter wants patient to remain FULL CODE.   GLOBAL - Rpt imaging to decide re : TNK via chest tube Palliative care input for goals of care   The patient is critically ill with multiple organ systems failure and requires high complexity decision making for assessment and support, frequent evaluation and titration of therapies, application of advanced monitoring technologies and extensive interpretation of multiple databases.   Critical Care Time devoted to patient care services described in this note is  45 Minutes.   Cyril Mourning MD. Tonny Bollman. [facility] Pager [phone] If no response call [phone]   09/11/2013 9:17 AM

## 2013-09-11 NOTE — Progress Notes (Deleted)
Pulmozyme 5 ml instilled via R pleural catheter using sterile technique.  Suction reconnected and tube clamped.

## 2013-09-11 NOTE — Progress Notes (Signed)
10 mg tpa instilled via rt pleural catheter using sterile technique. Tube clamped x 1hr. Pulmozyme to be instilled in 1hr.

## 2013-09-11 NOTE — Progress Notes (Signed)
Husband met with Palliative this AM.  Pt's code status is now a limited code.  No CPR and no cardioversion.  Husband also spoke with Dr. Elsworth Soho this AM.  In discussions with the husband following, the husband expresses that he knows what the pt's wishes are.  He siad that she has been very sick for the past 7 years and her life is in a recliner.  He advised that he called the daughter to come back to enable them to make decisions - she is from [facility].  He stated that the daughter wants aggressive treatment and for the pt to be a FC.  He stated that she has not lived in the area and does not realize how sick she has been.  He has told her to come back this afternoon for them to be able to make decisions for the pt.  She is driving back from [facility] now.

## 2013-09-11 NOTE — Consult Note (Signed)
Patient ZO:XWRUEAV:Cathy Robles      DOB: 09/07/1940      WUJ:[id number]     Consult Note from the Palliative Medicine Team at [facility]    Consult Requested by: Dr Blima DessertZubelevitsky     PCP: Bennie PieriniMARTIN,MARY MARGARET, FNP Reason for Consultation: Clarification of GOC and options    Phone [phone]  Assessment of patients Current state:  Overall poor long term prognosis.  Family faced with advanced directive decisions and anticipatory care needs.  This NP Lorinda CreedMary Larach reviewed medical records, received report from team, assessed the patient and then meet at the patient's bedside along with her husband Tomma Lightningddie Felmlee and then spoke with daughter Audrie Gallusammy Green # [phone] to discuss diagnosis prognosis, GOC, EOL wishes disposition and options.  A detailed discussion was had today regarding advanced directives.  Concepts specific to code status, artifical feeding and hydration, continued IV antibiotics and rehospitalization was had.  The difference between a aggressive medical intervention path  and a palliative comfort care path for this patient at this time was had.  Values and goals of care important to patient and family were attempted to be elicited.  Concept of Hospice and Palliative Care were discussed  Natural trajectory and expectations at EOL were discussed.  Questions and concerns addressed.  Hard Choices booklet left for review. Family encouraged to call with questions or concerns.  PMT will continue to support holistically.   Goals of Care: 1.  Code Status: Limited  -no CPR, cardio version   2. Scope of Treatment:  Continue with present medical treatment plan, family remains hopeful for improvement.  [facility] and will meet with this NP at 3pm  3. Disposition:  Dependant on outcomes   4. Symptom Management:   1.  Weakness/failure to thrive: continued medical management of treatable illness  5. Psychosocial:  Emotional support  offered to family, daughter is having a difficult time coming to terms with the overall poor prognosis  6. Spiritual: Chaplain consulted     Patient Documents Completed or Given: Document Given Completed  Advanced Directives Pkt    MOST yes   DNR    Gone from My Sight    Hard Choices yes     Brief HPI: -73 yo white female with multiple co-morbidites (COPD, CM with EF 20-25%, AF, obesity  [facility] and septic shock. [facility] She was treated with antibiotics and was cardioverted to try to improve hemodynamics. The patient is intubated and sedated.  Poor response to medical interventions.   ROS: unable to illicit, patient is sedated on vent support    PMH:  Past Medical History  Diagnosis Date  . Atrial fibrillation with rapid ventricular response   . LBBB (left bundle branch block)     Chronic  . History of chest pain     a.  2011 Cath:  Minimal Coronary Plaque  . Hiatal hernia   . GERD (gastroesophageal reflux disease)   . Asthma   . Current tobacco use     Currently smoking 1 pack per day  . DJD (degenerative joint disease)   . Spinal stenosis     At L5-S1  . Chronic back pain   . Osteoarthritis   . Anxiety   . Hypertension   . Hyperlipidemia   . Colon polyps   . Hypothyroidism   . Fibromyalgia   . History of headache   . Cardiomyopathy 2011  a. Possibly related to Afib with RVR;  b. 12/2009 Echo: EF 20-25%, anteroseptal, apical, inferior AK. Mild MR. Mildly dilated LA.  Marland Kitchen Pleural effusion 09/10/2013    Bilateral, R>L   . Morbid obesity 09/09/2013  . History of noncompliance with medical treatment, presenting hazards to health 09/09/2013  . NICM (nonischemic cardiomyopathy) - cath 2011 with minimal plaque 09/09/2013  . NSTEMI (non-ST elevated myocardial infarction) likely due to demand ischemia 09/09/2013  . Pneumococcal lobar pneumonia 09/07/2013  . Acute respiratory failure 09/09/2013  . Acute on  chronic respiratory failure 08/27/2013  . Acute on chronic systolic heart failure 09/05/2013  . Septic shock(785.52) 08/27/2013  . Paroxysmal atrial fibrillation 01/15/2010    Qualifier: Diagnosis of  By: Cristela Felt, CNA, Christy    . TOBACCO ABUSE 01/15/2010    Qualifier: Diagnosis of  By: Cristela Felt, CNA, Christy     . LBBB 01/15/2010    Qualifier: Diagnosis of  By: Cristela Felt, CNA, Christy    . GERD 01/15/2010    Qualifier: Diagnosis of  By: Cristela Felt, CNA, Christy    . BACK PAIN, CHRONIC 01/15/2010    Qualifier: Diagnosis of  By: Cristela Felt, CNA, Christy    . ANXIETY 01/15/2010    Qualifier: Diagnosis of  By: Cristela Felt, CNA, Christy    . HYPERLIPIDEMIA 01/15/2010    Qualifier: Diagnosis of  By: Cristela Felt CNA, Christy       PSH: Past Surgical History  Procedure Laterality Date  . Tonsillectomy and adenoidectomy      As a child  . Tubal ligation    . Partial hysterectomy    . Bladder tack    . Rotator cuff repair      Right  . 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 Lumbar laminectomy/decompression microdiscectomy      Lumbar decompression  . Total knee arthroplasty  2002    Left  . Cervical discectomy      Details unknown   I have reviewed the FH and SH and  If appropriate update it with new information. Allergies  Allergen Reactions  . Aspirin     Pt intubated (08/2013) and unable to verbalize reaction, but affirms allergy (so does family)  . Codeine     REACTION: itch with large doses  . Iodine Itching    Felt hot  . Levofloxacin     REACTION: itch  . Meperidine Hcl     REACTION: nausea  . Methadone     REACTION: ? confusion  . Penicillins     REACTION: hives  . Sulfonamide Derivatives     REACTION: nausea,vomiting,itching   Scheduled Meds: . amiodarone  400 mg Oral Daily  . antiseptic oral rinse  15 mL Mouth Rinse QID  . cefTRIAXone (ROCEPHIN)  IV  2 g Intravenous Q24H  . chlorhexidine  15 mL Mouth Rinse BID  . docusate  100 mg Oral Daily  . feeding supplement (PRO-STAT SUGAR FREE  64)  30 mL Per Tube TID  . feeding supplement (VITAL HIGH PROTEIN)  1,000 mL Per Tube Q24H  . insulin aspart  2-6 Units Subcutaneous 6 times per day  . levothyroxine  150 mcg Oral QAC breakfast  . multivitamin  5 mL Per Tube Daily  . nystatin cream   Topical TID  . pantoprazole sodium  40 mg Per Tube Q1200  . pneumococcal 23 valent vaccine  0.5 mL Intramuscular Tomorrow-1000  . sodium chloride  10-40 mL Intracatheter Q12H   Continuous  Infusions: . sodium chloride 10 mL/hr at 09/11/13 0600  . fentaNYL infusion INTRAVENOUS 200 mcg/hr (09/11/13 0800)  . heparin 1,300 Units/hr (09/11/13 0800)  . norepinephrine (LEVOPHED) Adult infusion 7 mcg/min (09/11/13 0800)   PRN Meds:.fentaNYL, fentaNYL, HYDROcodone-acetaminophen, midazolam, sodium chloride    BP 98/59  Pulse 59  Temp(Src) 98.1 F (36.7 C) (Oral)  Resp 22  Ht 5' (1.524 m)  Wt 107.7 kg (237 lb 7 oz)  BMI 46.37 kg/m2  SpO2 100%     Intake/Output Summary (Last 24 hours) at 09/11/13 0946 Last data filed at 09/11/13 0800  Gross per 24 hour  Intake 2676.58 ml  Output   1475 ml  Net 1201.58 ml    Physical Exam:  General: ill appearing, vent supported HEENT:  Noted ETT, moist membranes Chest:  Noted chest tube, scattered coarse BS CVS: RRR Abdomen:obese, soft NT Ext: BLE trace edema Neuro: sedated  Labs: CBC    Component Value Date/Time   WBC 14.3* 09/11/2013 0420   RBC 3.60* 09/11/2013 0420   HGB 10.4* 09/11/2013 0420   HCT 33.7* 09/11/2013 0420   PLT 92* 09/11/2013 0420   MCV 93.6 09/11/2013 0420   MCH 28.9 09/11/2013 0420   MCHC 30.9 09/11/2013 0420   RDW 17.7* 09/11/2013 0420   LYMPHSABS 1.1 09/15/2013 0300   MONOABS 1.1* 08/26/2013 0300   EOSABS 0.0 09/06/2013 0300   BASOSABS 0.0 09/22/2013 0300    BMET    Component Value Date/Time   NA 136* 09/11/2013 0420   K 3.4* 09/11/2013 0420   CL 100 09/11/2013 0420   CO2 26 09/11/2013 0420   GLUCOSE 119* 09/11/2013 0420   BUN 26* 09/11/2013 0420   CREATININE 0.66  09/11/2013 0420   CALCIUM 7.2* 09/11/2013 0420   GFRNONAA 87* 09/11/2013 0420   GFRAA >90 09/11/2013 0420    CMP     Component Value Date/Time   NA 136* 09/11/2013 0420   K 3.4* 09/11/2013 0420   CL 100 09/11/2013 0420   CO2 26 09/11/2013 0420   GLUCOSE 119* 09/11/2013 0420   BUN 26* 09/11/2013 0420   CREATININE 0.66 09/11/2013 0420   CALCIUM 7.2* 09/11/2013 0420   PROT 5.1* 09/11/2013 0420   ALBUMIN 1.7* 09/11/2013 0420   AST 106* 09/11/2013 0420   ALT 168* 09/11/2013 0420   ALKPHOS 92 09/11/2013 0420   BILITOT 0.5 09/11/2013 0420   GFRNONAA 87* 09/11/2013 0420   GFRAA >90 09/11/2013 0420     Time In Time Out Total Time Spent with Patient Total Overall Time  0900 1015 70 min 75 min    Greater than 50%  of this time was spent counseling and coordinating care related to the above assessment and plan.   Lorinda Creed NP  Palliative Medicine Team Team Phone # [phone] Pager [phone]  Discussed with Dr Vassie Loll

## 2013-09-11 NOTE — Progress Notes (Signed)
Pulmozyme 5 ml instilled via R pleural catheter using sterile technique.  Suction reconnected and tube clamped.  

## 2013-09-11 NOTE — Progress Notes (Signed)
Subjective: Rt empyema Rt chest tube drain placed 5/18 Back on full sedation/vent  Objective: Vital signs in last 24 hours: Temp:  [97.2 F (36.2 C)-98.7 F (37.1 C)] 98.1 F (36.7 C) (05/20 0700) Pulse Rate:  [64-80] 68 (05/20 0800) Resp:  [15-25] 22 (05/20 0800) BP: (72-118)/(31-96) 89/57 mmHg (05/20 0800) SpO2:  [99 %-100 %] 100 % (05/20 0800) FiO2 (%):  [50 %] 50 % (05/20 0800) Weight:  [237 lb 7 oz (107.7 kg)] 237 lb 7 oz (107.7 kg) (05/20 0400) Last BM Date: 09/10/13   Chest tube in place Site clean and dry; NT Output has slowed since the first 24 hours WBC trending down--14.3   Lab Results:   Recent Labs  09/10/13 0444 09/11/13 0420  WBC 16.3* 14.3*  HGB 11.2* 10.4*  HCT 35.8* 33.7*  PLT 106* 92*   BMET  Recent Labs  09/10/13 0444 09/11/13 0420  NA 136* 136*  K 3.3* 3.4*  CL 100 100  CO2 24 26  GLUCOSE 131* 119*  BUN 30* 26*  CREATININE 0.73 0.66  CALCIUM 7.5* 7.2*   PT/INR  Recent Labs  09/09/13 0400 09/10/13 0444  LABPROT 18.8* 18.0*  INR 1.62* 1.53*   ABG  Recent Labs  09/03/2013 0946 09/09/13 0401  PHART 7.352 7.357  HCO3 20.7 20.7    Studies/Results: Dg Chest Port 1 View  09/10/2013   CLINICAL DATA:  73 year old female with pleural effusion. Lower lobe consolidation. Acute respiratory failure. Initial encounter.  EXAM: PORTABLE CHEST - 1 VIEW  COMPARISON:  09/09/2013 and earlier.  FINDINGS: Portable AP semi upright view at 0610 hrs. New right pigtail pleural catheter in place. Density at the right lung base not significantly changed. No pneumothorax.  Stable endotracheal tube tip at the level the clavicles. Stable right IJ approach central line tip at the level of the cavoatrial junction. Stable visualized enteric tube.  The patient is now more rotated to the left. Veiling opacity at the left lung base is stable.  IMPRESSION: 1. Right pigtail pleural catheter placed. No pneumothorax. Density at the right lung base not significantly  changed. 2.  Otherwise, stable lines and tubes. 3. Stable left lung base density representing a combination of pleural fluid and consolidation, as seen on CT 09/21/2013.   Electronically Signed   By: Augusto GambleLee  Hall M.D.   On: 09/10/2013 07:54   Ct Image Guided Drainage By Percutaneous Catheter  09/09/2013   CLINICAL DATA:  Respiratory failure, pneumonia, loculated pleural effusion.  EXAM: CT GUIDED RIGHT PLEURAL DRAIN PLACEMENT  ANESTHESIA/SEDATION: Intravenous Fentanyl and Versed were administered as conscious sedation during continuous cardiorespiratory monitoring by the radiology RN, with a total moderate sedation time of 15 minutes.  PROCEDURE: The procedure, risks, benefits, and alternatives were explained to the family. Questions regarding the procedure were encouraged and answered. The family understands and consents to the procedure.  Patient placed in left lateral decubitus positioning. Limited axial scans through the thorax obtained. An appropriate skin entry site was identified.  The right lateral chest wall was prepped with Betadinein a sterile fashion, and a sterile drape was applied covering the operative field. A sterile gown and sterile gloves were used for the procedure. Local anesthesia was provided with 1% Lidocaine.  Under CT fluoroscopic guidance, a 19 gauge percutaneous entry needle was advanced into the right pleural space. Cloudy greenish 10 fluid returned. Amplatz wire advanced easily, its position confirmed on CT fluoroscopy. Tract was dilated to allow placement of a 14 French pigtail catheter, formed  with in the dependent aspect of the effusion. CT confirms appropriate positioning. Approximately 10 mL of slightly cloudy amber fluid were aspirated, sent for routine Gram stain and culture. Catheter was secured externally with 0-Prolene suture and StatLock and placed to a Pleur-Evac at 20 cmH2O suction. The patient tolerated the procedure well.  COMPLICATIONS: None immediate  FINDINGS: Limited  scans demonstrate a partially loculated bilateral pleural effusions. Airspace consolidation in both lower lobes with air bronchograms again evident. #14-French pigtail catheter placed in the loculated right pleural effusion.  IMPRESSION: 1. Technically successful CT-guided right pleural drain catheter placement. A sample of the aspirate sent for routine Gram stain and culture.   Electronically Signed   By: Oley Balm M.D.   On: 09/09/2013 17:28    Anti-infectives: Anti-infectives   Start     Dose/Rate Route Frequency Ordered Stop   10/06/2013 1800  ceFEPIme (MAXIPIME) 1 g in dextrose 5 % 50 mL IVPB  Status:  Discontinued     1 g 100 mL/hr over 30 Minutes Intravenous Every 12 hours 2013-10-06 0623 10/06/2013 1334   Oct 06, 2013 1400  cefTRIAXone (ROCEPHIN) 2 g in dextrose 5 % 50 mL IVPB     2 g 100 mL/hr over 30 Minutes Intravenous Every 24 hours 10/06/13 1334     Oct 06, 2013 1400  azithromycin (ZITHROMAX) 500 mg in dextrose 5 % 250 mL IVPB  Status:  Discontinued     500 mg 250 mL/hr over 60 Minutes Intravenous Every 24 hours 2013-10-06 1334 October 06, 2013 1349   06-Oct-2013 0800  vancomycin (VANCOCIN) IVPB 750 mg/150 ml premix     750 mg 150 mL/hr over 60 Minutes Intravenous Every 12 hours 2013/10/06 0623     2013/10/06 0330  ceFEPIme (MAXIPIME) 2 g in dextrose 5 % 50 mL IVPB     2 g 100 mL/hr over 30 Minutes Intravenous  Once 2013-10-06 0321 2013/10/06 0415      Assessment/Plan: Rt empyema chest tube drain placed 5/18 Remains sedated on vent. Output slowing down Will cont to follow   LOS: 3 days    Brayton El 09/11/2013

## 2013-09-11 NOTE — Progress Notes (Signed)
Elink notified about increase of bloody secretions through ET tube.  No new orders at this time.  Will continue to monitor.  Roselie Awkward, RN

## 2013-09-11 NOTE — Progress Notes (Signed)
Chaplain responded to spiritual care consult for Palliative Care. Presented to pt's husband who was tearful during visit. He said pt will sometimes respond by opening her eyes and smiling at him. He explained that pt has been unwell for many years and has expressed feeling "tired of living." He said that pt has told him that if her health "is going down and not up, she does not want to remain on a ventilator." He said he is waiting to speak with pt's daughter before making any final decisions. Chaplain provided emotional support through empathic listening, pastoral presence, and prayer. He appreciated visit. Will refer to unit chaplain, please page for follow up.   Maurene Capes [phone]

## 2013-09-11 NOTE — Progress Notes (Signed)
Pharmacy Consult Note  Pharmacy Consult for Heparin == > Lovenox Indication: atrial fibrillation  Patient Measurements: Height: 5' (152.4 cm) Weight: 237 lb 7 oz (107.7 kg) IBW/kg (Calculated) : 45.5 Dosing Weight: 70 kg  Vital Signs: Temp: 98.1 F (36.7 C) (05/20 0700) Temp src: Oral (05/20 0700) BP: 98/59 mmHg (05/20 0900) Pulse Rate: 59 (05/20 0900)  Labs:  Recent Labs  09/01/2013 1940  09/09/13 0400 09/10/13 0444 09/11/13 0355 09/11/13 0420  HGB  --   < > 11.9* 11.2*  --  10.4*  HCT  --   --  37.3 35.8*  --  33.7*  PLT  --   --  121* 106*  --  92*  LABPROT  --   --  18.8* 18.0*  --   --   INR  --   --  1.62* 1.53*  --   --   HEPARINUNFRC  --   < > 0.38 0.29* 0.27*  --   CREATININE  --   --  0.93 0.73  --  0.66  TROPONINI 0.44*  --  0.31*  --   --   --   < > = values in this interval not displayed.  Estimated Creatinine Clearance: 72.7 ml/min (by C-G formula based on Cr of 0.66).  Assessment: 73 yo female with Afib for heparin, now to transition to Lovenox Non-compliant with Pradaxa PTA Heparin off 5/18 for chest tube placement Renal function stable  Plts decreased to 92  Goal of Therapy:  Appropriate Lovenox dosing Monitor platelets by anticoagulation protocol: Yes   Plan:  Lovenox 105 mg sq Q 12 hours DC heparin Continue to follow  Thank you. Okey Regal, PharmD [phone]

## 2013-09-12 DIAGNOSIS — R791 Abnormal coagulation profile: Secondary | ICD-10-CM

## 2013-09-12 LAB — BASIC METABOLIC PANEL
BUN: 24 mg/dL — ABNORMAL HIGH (ref 6–23)
CO2: 28 mEq/L (ref 19–32)
Calcium: 7.3 mg/dL — ABNORMAL LOW (ref 8.4–10.5)
Chloride: 98 mEq/L (ref 96–112)
Creatinine, Ser: 0.56 mg/dL (ref 0.50–1.10)
GFR calc Af Amer: >90 mL/min (ref >90)
GFR calc non Af Amer: >90 mL/min (ref >90)
Glucose, Bld: 129 mg/dL — ABNORMAL HIGH (ref 70–99)
Potassium: 3.3 mEq/L — ABNORMAL LOW (ref 3.7–5.3)
SODIUM: 137 meq/L (ref 137–147)

## 2013-09-12 LAB — CBC
HEMATOCRIT: 34.3 % — AB (ref 36.0–46.0)
HEMOGLOBIN: 10.4 g/dL — AB (ref 12.0–15.0)
MCH: 28.7 pg (ref 26.0–34.0)
MCHC: 30.3 g/dL (ref 30.0–36.0)
MCV: 94.8 fL (ref 78.0–100.0)
PLATELETS: 109 10*3/uL — AB (ref 150–400)
RBC: 3.62 MIL/uL — AB (ref 3.87–5.11)
RDW: 17.6 % — ABNORMAL HIGH (ref 11.5–15.5)
WBC: 17.9 10*3/uL — AB (ref 4.0–10.5)

## 2013-09-12 LAB — GLUCOSE, CAPILLARY
GLUCOSE-CAPILLARY: 109 mg/dL — AB (ref 70–99)
GLUCOSE-CAPILLARY: 116 mg/dL — AB (ref 70–99)
Glucose-Capillary: 131 mg/dL — ABNORMAL HIGH (ref 70–99)
Glucose-Capillary: 134 mg/dL — ABNORMAL HIGH (ref 70–99)
Glucose-Capillary: 123 mg/dL — ABNORMAL HIGH (ref 70–99)
Glucose-Capillary: 134 mg/dL — ABNORMAL HIGH (ref 70–99)
Glucose-Capillary: 122 mg/dL — ABNORMAL HIGH (ref 70–99)

## 2013-09-12 LAB — HEPATIC FUNCTION PANEL
ALBUMIN: 1.7 g/dL — AB (ref 3.5–5.2)
ALK PHOS: 77 U/L (ref 39–117)
ALT: 124 U/L — AB (ref 0–35)
AST: 73 U/L — AB (ref 0–37)
Bilirubin, Direct: 0.3 mg/dL (ref 0.0–0.3)
Indirect Bilirubin: 0.2 mg/dL — ABNORMAL LOW (ref 0.3–0.9)
TOTAL PROTEIN: 5.2 g/dL — AB (ref 6.0–8.3)
Total Bilirubin: 0.5 mg/dL (ref 0.3–1.2)

## 2013-09-12 LAB — MAGNESIUM: Magnesium: 1.5 mg/dL (ref 1.5–2.5)

## 2013-09-12 LAB — PHOSPHORUS: Phosphorus: 3.3 mg/dL (ref 2.3–4.6)

## 2013-09-12 MED ORDER — MAGNESIUM SULFATE 50 % IJ SOLN
6.0000 g | Freq: Once | INTRAMUSCULAR | Status: AC
  Administered 2013-09-12: 6 g via INTRAVENOUS
  Filled 2013-09-12: qty 12

## 2013-09-12 MED ORDER — POTASSIUM CHLORIDE 20 MEQ/15ML (10%) PO LIQD
20.0000 meq | ORAL | Status: AC
  Administered 2013-09-12 (×2): 20 meq
  Filled 2013-09-12 (×2): qty 15

## 2013-09-12 MED ORDER — SODIUM CHLORIDE 0.9 % IV SOLN
0.0000 ug/h | INTRAVENOUS | Status: DC
  Administered 2013-09-14: 25 ug/h via INTRAVENOUS
  Administered 2013-09-14: 10 ug/h via INTRAVENOUS
  Administered 2013-09-15 – 2013-09-16 (×3): 250 ug/h via INTRAVENOUS
  Filled 2013-09-12 (×5): qty 50

## 2013-09-12 MED ORDER — BISACODYL 10 MG RE SUPP
10.0000 mg | Freq: Every day | RECTAL | Status: DC | PRN
Start: 2013-09-12 — End: 2013-09-12

## 2013-09-12 MED ORDER — TRAZODONE HCL 50 MG PO TABS
50.0000 mg | ORAL_TABLET | Freq: Every evening | ORAL | Status: DC | PRN
  Filled 2013-09-12: qty 1

## 2013-09-12 MED ORDER — LORAZEPAM 2 MG/ML IJ SOLN
1.0000 mg | INTRAMUSCULAR | Status: DC | PRN
  Administered 2013-09-12 – 2013-09-14 (×4): 1 mg via INTRAVENOUS
  Filled 2013-09-12 (×4): qty 1

## 2013-09-12 NOTE — Progress Notes (Signed)
RT note- wean stopped per MD, not needed to wean at this time.

## 2013-09-12 NOTE — Progress Notes (Signed)
Pt is comfort care but has labs ordered and meds scheduled for tonight. CCM MD called to clarify. Labs will not need to be drawn. Meds can still be given. Pt has become more agitated and is pulling at lines. Pt can get Trazodone to help her sleep per MD order. Will continue to assess.  Alfonso Ellis, RN

## 2013-09-12 NOTE — Progress Notes (Signed)
Progress Note from the Palliative Medicine Team at [facility]  Subjective:   -continued conversation with family regarding diagnosis, prognosis, GOC and options, values important to the patient a and family were attempted to be illicited.  Natural trajectory and expectations at EOL were discussed (daughter Dianna Rossettiammy Williams is present today along with Link Snufferddie Levick the patient's husband)  -Family both in agreement that comfort is main focus of care, they wish to proceed with that treatment plan today, "she has suffered enough in her life".  No escalation of care, liberate from the ventilator   Objective: Allergies  Allergen Reactions  . Aspirin     Pt intubated (08/2013) and unable to verbalize reaction, but affirms allergy (so does family)  . Codeine     REACTION: itch with large doses  . Iodine Itching    Felt hot  . Levofloxacin     REACTION: itch  . Meperidine Hcl     REACTION: nausea  . Methadone     REACTION: ? confusion  . Penicillins     REACTION: hives  . Sulfonamide Derivatives     REACTION: nausea,vomiting,itching   Scheduled Meds: . alteplase  10 mg Intracatheter Once  . amiodarone  400 mg Per Tube Daily  . antiseptic oral rinse  15 mL Mouth Rinse QID  . cefTRIAXone (ROCEPHIN)  IV  2 g Intravenous Q24H  . chlorhexidine  15 mL Mouth Rinse BID  . docusate  100 mg Oral Daily  . enoxaparin (LOVENOX) injection  105 mg Subcutaneous Q12H  . feeding supplement (PRO-STAT SUGAR FREE 64)  30 mL Per Tube TID  . feeding supplement (VITAL HIGH PROTEIN)  1,000 mL Per Tube Q24H  . insulin aspart  2-6 Units Subcutaneous 6 times per day  . levothyroxine  150 mcg Oral QAC breakfast  . metroNIDAZOLE  500 mg Oral 3 times per day  . multivitamin  5 mL Per Tube Daily  . nystatin cream   Topical TID  . pantoprazole sodium  40 mg Per Tube Q1200  . pneumococcal 23 valent vaccine  0.5 mL Intramuscular Tomorrow-1000  . sodium chloride  10-40 mL Intracatheter Q12H  . vancomycin  125 mg Oral  QID   Continuous Infusions: . sodium chloride 10 mL/hr at 09/11/13 1900  . fentaNYL infusion INTRAVENOUS 100 mcg/hr (09/12/13 0600)  . norepinephrine (LEVOPHED) Adult infusion 10 mcg/min (09/12/13 1200)   PRN Meds:.fentaNYL, fentaNYL, HYDROcodone-acetaminophen, midazolam, sodium chloride  BP 110/62  Pulse 74  Temp(Src) 98.7 F (37.1 C) (Oral)  Resp 20  Ht 5' (1.524 m)  Wt 109.2 kg (240 lb 11.9 oz)  BMI 47.02 kg/m2  SpO2 95%   PPS:30 % at best     Intake/Output Summary (Last 24 hours) at 09/12/13 1511 Last data filed at 09/12/13 1200  Gross per 24 hour  Intake 1786.3 ml  Output   1990 ml  Net -203.7 ml      LBM: rectal tube      Physical Exam:  General: intubated, chronically ill apearing HEENT:  moist membranes, noted ET tube Chest:  scattered coarse BS CVS: RRR Abdomen:soft NT +BS Ext: BUE with +2 edema Neuro: follows simple commands  Labs: CBC    Component Value Date/Time   WBC 17.9* 09/12/2013 0340   RBC 3.62* 09/12/2013 0340   HGB 10.4* 09/12/2013 0340   HCT 34.3* 09/12/2013 0340   PLT 109* 09/12/2013 0340   MCV 94.8 09/12/2013 0340   MCH 28.7 09/12/2013 0340   MCHC 30.3 09/12/2013 0340  RDW 17.6* 09/12/2013 0340   LYMPHSABS 1.1 09/20/2013 0300   MONOABS 1.1* 08/26/2013 0300   EOSABS 0.0 09/15/2013 0300   BASOSABS 0.0 08/28/2013 0300    BMET    Component Value Date/Time   NA 137 09/12/2013 0340   K 3.3* 09/12/2013 0340   CL 98 09/12/2013 0340   CO2 28 09/12/2013 0340   GLUCOSE 129* 09/12/2013 0340   BUN 24* 09/12/2013 0340   CREATININE 0.56 09/12/2013 0340   CALCIUM 7.3* 09/12/2013 0340   GFRNONAA >90 09/12/2013 0340   GFRAA >90 09/12/2013 0340    CMP     Component Value Date/Time   NA 137 09/12/2013 0340   K 3.3* 09/12/2013 0340   CL 98 09/12/2013 0340   CO2 28 09/12/2013 0340   GLUCOSE 129* 09/12/2013 0340   BUN 24* 09/12/2013 0340   CREATININE 0.56 09/12/2013 0340   CALCIUM 7.3* 09/12/2013 0340   PROT 5.2* 09/12/2013 0340   ALBUMIN 1.7* 09/12/2013 0340    AST 73* 09/12/2013 0340   ALT 124* 09/12/2013 0340   ALKPHOS 77 09/12/2013 0340   BILITOT 0.5 09/12/2013 0340   GFRNONAA >90 09/12/2013 0340   GFRAA >90 09/12/2013 0340     Assessment and Plan:  1. Code Status:  DNR/DNI-comfort is main focus of care    Family in agreement for liberation from the ventilator.  They hope for that to occur today while her daughter and family are here at the hospital.  Daughter plans to stay with her tonight; nursing to coordinate her in room stay.  Plan is to continue present  IV medications; antibiotics, fluids and pressors.  No ESCALATION of care.  If patient decline,  focus is full comfort/symptom management    2. Symptom Control: Pain/Dyspnea  Continue continuous  Fentanyl gtt, may titrate to comfort          Agitation  Ativan 1 mg IV every 4 hrs prn  3. Psycho/Social:  Emotional support offered to daughter and husband.  Both verbalize that they "know" the patient never wanted intubation .  She did everything she could "to avoid doctors and hospitals".  Her baseline health was poor, with reported moderate signs of dementia.  Both hope that she does not"suffe anymore"  yet continue to hope for improvement  4. Disposition:  Dependant on outcomes  Patient Documents Completed or Given: Document Given Completed  Advanced Directives Pkt    MOST yes   DNR    Gone from My Sight    Hard Choices  yes    Time In Time Out Total Time Spent with Patient Total Overall Time  1400 1500 60 min 60 min    Greater than 50%  of this time was spent counseling and coordinating care related to the above assessment and plan.  Lorinda Creed NP  Palliative Medicine Team Team Phone # [phone] Pager [phone]  Discussed with Dr Kendrick Fries 1

## 2013-09-12 NOTE — Progress Notes (Signed)
PULMONARY  / CRITICAL CARE MEDICINE  Name: Cathy Robles MRN: [id number] DOB: [birth year] PCP Bennie Pierini, FNP   ADMISSION DATE:  09/21/13 LOS 4 days  REFERRING MD :  [facility] PRIMARY SERVICE: CCM  CHIEF COMPLAINT:  PNA/Respiratory failure  BRIEF PATIENT DESCRIPTION:    73 y/o obese 1.5 PPD smoker  with PAF, CHF due to NICM EF 20%, LBBB, chronic pain syndrome, hypothyroidism and COPD with ongoing tobacco use admitted from [facility] on 2013/09/21 for respiratory failure due to bilateral PNA.   Admitted in 2011 with respiratory failure and CHF in setting of AF with RVR. Echo Ef 25%. Underwent cath. Normal coronary arteries EF 40%. Underwent DC-CV. Placed on amio and Pradaxa. Last saw Dr. Antoine Poche in 2012 and was in NSR, dc'd from cardiology due to repeated no-shows.  Daughter came to visit 5/17 and was severely dyspneic and called 911. Taken to [facility] Was hypotensive (SBP 60s) and in extreme respiratory distress as well as AF with RVR.  Intubated in ER. CXR with bilateral infiltrates PNA vs CHF. WBC 23K. BNP 1110  Underwent emergent DC-CV of AF. Started on neo without much benefit. Levophed added in ambulance.  Bedside echo LVEF 15-20%. RV normal.     LINES / TUBES: RIJ CVL 09-21-2013 >>> R radial arterial line 09/21/13>>> ETT 09/21/2013 [facility]) >>> Rt chest tube (IR) 5/18 >>  CULTURES: BCX x 2 (2013/09/21) >>>ng Sputum CX >>neg UA (09/21/13 >> negative at [facility] ....... Urine strep - POSITIVE Urine leg neg resp virus pcr tracheal aspirate  Pl fluid rt >> ng C diff pCR 5/19 POS  ANTIBIOTICS: Vancomycin Sep 21, 2013 >>> Cefipime 08/29/13 >>>5/17, CEftriaxone 5/17 >> PO vanc 5/20 >> PO flagyl >>  Levofloxacin x 1 at [facility] 08/29/13 >>> d/c'd due to reported allergy  PCN alllergy (hives) on chart   SIGNIFICANT EVENTS / STUDIES:  Intubated 09/21/13 DC-CV of AF at [facility] Sep 21, 2013 Rt chest tube (IR) 5/18 Duplex LUE neg 5/18 5/20 tpa &  pulmozyme into pleural space -drained 260 cc    SUBJECTIVE/OVERNIGHT/INTERVAL HX Remains on levophed higher dose Afebrile Not in pain, int agitation Chest tube- 260 cc drainage   VITAL SIGNS: Filed Vitals:   09/12/13 0630 09/12/13 0645 09/12/13 0700 09/12/13 0800  BP: 97/33 117/70 100/41 110/62  Pulse: 72 74 71 74  Temp:      TempSrc:      Resp: 20 25 16 20   Height:      Weight:      SpO2: 97% 96% 98% 95%      HEMODYNAMICS:   VENTILATOR SETTINGS: Vent Mode:  [-] PRVC FiO2 (%):  [30 %] 30 % Set Rate:  [28 bmp] 28 bmp Vt Set:  [420 mL] 420 mL PEEP:  [5 cmH20] 5 cmH20 Plateau Pressure:  [4 cmH20-21 cmH20] 20 cmH20 INTAKE / OUTPUT: I/O last 3 completed shifts: In: 3549.8 [I.V.:1814.8; Other:45; NG/GT:1340; IV Piggyback:350] Out: 2955 [Urine:2575; Stool:100; Chest Tube:280]   PHYSICAL EXAMINATION: General:  Chronically and critically ill appearing.  HEENT:  poor dentition and ETT Neck: supple. Thick.noJVD. Carotids no bruits. No lymphadenopathy or thryomegaly appreciated. Cor: PMI nonpalpable. Regular rate & rhythm. Distant HS No obvious rubs, gallops or murmurs. Fungal rash on chest -improved Lungs: + rhonchi, rt chest tube serosanguinous fluid Abdomen: obese soft, nontender, nondistended. No hepatosplenomegaly. No bruits or masses. Good bowel sounds. Extremities: no cyanosis, clubbing, rash. Cool trace edema Neuro: . Follows commands. Nonfocal, RASS -1  LABS: PULMONARY  Recent Labs Lab Sep 21, 2013 0354 09-21-13  0946 09/09/13 0401 09/10/13 0448 09/11/13 0500  PHART 7.191* 7.352 7.357  --   --   PCO2ART 47.1* 36.9 37.8  --   --   PO2ART 125.0* 144.0* 87.2  --   --   HCO3 17.4* 20.7 20.7  --   --   TCO2 18.8 22 21.8  --   --   O2SAT 96.1 99.0 67.0  94.9 65.0 65.4    CBC  Recent Labs Lab 09/10/13 0444 09/11/13 0420 09/12/13 0340  HGB 11.2* 10.4* 10.4*  HCT 35.8* 33.7* 34.3*  WBC 16.3* 14.3* 17.9*  PLT 106* 92* 109*    COAGULATION  Recent  Labs Lab 09/09/13 0400 09/10/13 0444  INR 1.62* 1.53*    CARDIAC    Recent Labs Lab 09/18/2013 0309 09/07/2013 0907 09/11/2013 1940 09/09/13 0400  TROPONINI <0.30 0.70* 0.44* 0.31*    Recent Labs Lab 08/29/2013 0343 09/09/13 0400  PROBNP 10714.0* 6702.0*     CHEMISTRY  Recent Labs Lab 09/01/2013 1435 09/09/13 0400 09/10/13 0444 09/11/13 0420 09/12/13 0340  NA 134* 133* 136* 136* 137  K 4.1 3.6* 3.3* 3.4* 3.3*  CL 99 97 100 100 98  CO2 19 20 24 26 28   GLUCOSE 120* 104* 131* 119* 129*  BUN 50* 42* 30* 26* 24*  CREATININE 1.07 0.93 0.73 0.66 0.56  CALCIUM 7.7* 7.8* 7.5* 7.2* 7.3*  MG  --  1.9 1.7 1.6 1.5  PHOS  --  2.6 2.9 2.8 3.3   Estimated Creatinine Clearance: 73.3 ml/min (by C-G formula based on Cr of 0.56).   LIVER  Recent Labs Lab 09/21/2013 0300 09/09/13 0400 09/10/13 0444 09/11/13 0420 09/12/13 0340  AST 678* 266* 147* 106* 73*  ALT 495* 369* 241* 168* 124*  ALKPHOS 89 91 105 92 77  BILITOT 1.2 0.7 0.5 0.5 0.5  PROT 5.1* 5.2* 5.0* 5.1* 5.2*  ALBUMIN 2.1* 2.1* 1.9* 1.7* 1.7*  INR  --  1.62* 1.53*  --   --      INFECTIOUS  Recent Labs Lab 09/17/2013 0308  09/12/2013 1320 09/09/13 0400 09/10/13 0444  LATICACIDVEN 2.5*  --   --  1.4  --   PROCALCITON  --   < > 1.34 1.07 0.52  < > = values in this interval not displayed.   ENDOCRINE CBG (last 3)   Recent Labs  09/11/13 2335 09/12/13 0339 09/12/13 0739  GLUCAP 116* 109* 123*      IMAGING x48h  Dg Chest Port 1 View  09/11/2013   CLINICAL DATA:  Followup right-sided pleural effusion  EXAM: PORTABLE CHEST - 1 VIEW  COMPARISON:  09/10/2013  FINDINGS: Cardiac shadow remains enlarged. An endotracheal tube, nasogastric catheter right jugular central line are again seen and stable. A right basilar drainage catheter is seen. Persistent likely loculated density is noted within the right lung base. This likely represents a combination of infiltrate and effusion. Stable changes are noted in the  left base with infiltrate and effusion. No new focal abnormality is seen.  IMPRESSION: Stable appearance of the chest when compared with the previous day.   Electronically Signed   By: Alcide Clever M.D.   On: 09/11/2013 09:34       ASSESSMENT / PLAN:  PULMONARY A: 1) VDRF likely due to severe pneumococal CAP -> septic shock,      2) Acute/chronic systolic HF d/t NICM EF 15-20% Rt loculated effusion vs empyema s/p IR chest tube -VATs (poor candidate in my opinion) - appreciate TCTS input  P:   Failing SBTs     CARDIOVASCULAR A: 1) PAF with RVR - s/p emergent [facility]      2) Acute/chronic systolic HF d/t NICM EF 15-20%      3) LBBB  4) [facility]side [facility] - ef 15% QTc on adm >55100msec P:  Continue  amio via tube Change to lovenox.  Levophed gtt Diurese once off pressors.   RENAL A: At risk for ATN Hypokalmeia P Monitor, replete K   GI A NPO P Ct  tube feeds  ID A:  1) Septic shock       2) CAP - severe pneumococcal CAP + Rt empyema       3) PCN and levofloxacin allergy  -MRSA PCR positive C diff colitis  P:   Abx as above Dc vanc Ct flagyl & vanc via tube   Heme - plts drop from 144k on adm, now rebounding Stop heparin if < 70k   ENDOCRINE  A:  1) h/o hypothyroidism - non compliant, TSH 23 P:     Resume synthroid 150    DERM A: 1) Fungal rash P: Nystatin Diflucan 100 mg  X 7ds   CODE STATUS:  According to family patient would not want life support. However daughter felt otherwise Appreciate Palliative care input for goals of care -DNR issued   The patient is critically ill with multiple organ systems failure and requires high complexity decision making for assessment and support, frequent evaluation and titration of therapies, application of advanced monitoring technologies and extensive interpretation of multiple databases.   Critical Care Time devoted to patient care services described in this note is  35  Minutes.   Cyril Mourningakesh Mikail Goostree MD. Tonny BollmanFCCP.  Pulmonary & Critical care Pager [phone] If no response call [phone]   09/12/2013 11:10 AM

## 2013-09-12 NOTE — Progress Notes (Signed)
RT note- Patient was extubated to 4l/min [place] for pallitive care measure and per MD order.

## 2013-09-12 NOTE — Progress Notes (Signed)
Failing SBTs   Patient: Cathy Robles / Admit Date: 09/15/2013 / Date of Encounter: 09/12/2013, 11:25 AM  Subjective  Denies pain. Still intubated.  Objective   Telemetry: NSR - 2 brief episodes of what appears to be SVT 5-18 beats (same morphology as sinus)  Physical Exam: Blood pressure 110/62, pulse 74, temperature 98.5 F (36.9 C), temperature source Oral, resp. rate 20, height 5' (1.524 m), weight 240 lb 11.9 oz (109.2 kg), SpO2 95.00%. General: Well developed obese WF in no acute distress  Head: Normocephalic, atraumatic, sclera non-icteric, no xanthomas, nares are without discharge.  Neck: JVP not elevated.  Lungs: Intubated. Coarse BS bilaterally. Breathing is unlabored.  Heart: Reg rhythm, controlled rate, S1 S2 without murmurs, rubs, or gallops.  Abdomen: Soft, non-tender, non-distended with normoactive bowel sounds. No rebound/guarding.  Extremities: No clubbing or cyanosis. Tr edema. Distal pedal pulses are 2+ and equal bilaterally.  Neuro: Arouses to sound of voice and follows simple commands.    Intake/Output Summary (Last 24 hours) at 09/12/13 1125 Last data filed at 09/12/13 1000  Gross per 24 hour  Intake 2166.9 ml  Output   2005 ml  Net  161.9 ml    Inpatient Medications:  . alteplase  10 mg Intracatheter Once  . amiodarone  400 mg Per Tube Daily  . antiseptic oral rinse  15 mL Mouth Rinse QID  . cefTRIAXone (ROCEPHIN)  IV  2 g Intravenous Q24H  . chlorhexidine  15 mL Mouth Rinse BID  . docusate  100 mg Oral Daily  . enoxaparin (LOVENOX) injection  105 mg Subcutaneous Q12H  . feeding supplement (PRO-STAT SUGAR FREE 64)  30 mL Per Tube TID  . feeding supplement (VITAL HIGH PROTEIN)  1,000 mL Per Tube Q24H  . insulin aspart  2-6 Units Subcutaneous 6 times per day  . levothyroxine  150 mcg Oral QAC breakfast  . magnesium sulfate LVP 250-500 ml  6 g Intravenous Once  . metroNIDAZOLE  500 mg Oral 3 times per day  . multivitamin  5 mL Per Tube Daily  .  nystatin cream   Topical TID  . pantoprazole sodium  40 mg Per Tube Q1200  . pneumococcal 23 valent vaccine  0.5 mL Intramuscular Tomorrow-1000  . sodium chloride  10-40 mL Intracatheter Q12H  . vancomycin  125 mg Oral QID   Infusions:  . sodium chloride 10 mL/hr at 09/11/13 1900  . fentaNYL infusion INTRAVENOUS 100 mcg/hr (09/12/13 0600)  . norepinephrine (LEVOPHED) Adult infusion 12 mcg/min (09/12/13 1000)    Labs:  Recent Labs  09/11/13 0420 09/12/13 0340  NA 136* 137  K 3.4* 3.3*  CL 100 98  CO2 26 28  GLUCOSE 119* 129*  BUN 26* 24*  CREATININE 0.66 0.56  CALCIUM 7.2* 7.3*  MG 1.6 1.5  PHOS 2.8 3.3    Recent Labs  09/11/13 0420 09/12/13 0340  AST 106* 73*  ALT 168* 124*  ALKPHOS 92 77  BILITOT 0.5 0.5  PROT 5.1* 5.2*  ALBUMIN 1.7* 1.7*    Recent Labs  09/11/13 0420 09/12/13 0340  WBC 14.3* 17.9*  HGB 10.4* 10.4*  HCT 33.7* 34.3*  MCV 93.6 94.8  PLT 92* 109*   No results found for this basename: CKTOTAL, CKMB, TROPONINI,  in the last 72 hours No components found with this basename: POCBNP,  No results found for this basename: HGBA1C,  in the last 72 hours   Radiology/Studies:  Ct Chest Wo Contrast  08/27/2013   CLINICAL DATA:  Evaluate empyema.  EXAM: CT CHEST WITHOUT CONTRAST  TECHNIQUE: Multidetector CT imaging of the chest was performed following the standard protocol without IV contrast.  COMPARISON:  09/10/2013  FINDINGS: The heart size is moderately enlarged. There is no pericardial effusion. Calcified atherosclerotic disease involves the thoracic aorta as well as the LAD and RCA coronary arteries. Prominent mediastinal lymph nodes are identified in may be reactive. There is no axillary or supraclavicular adenopathy.  Moderate volume left pleural effusion is identified. A moderate volume of loculated right pleural effusion is also identified. This may represent an empyema. Dense airspace consolidation containing air bronchograms is identified within  both lower lobes as well as the right middle lobe. Patchy areas of ground-glass attenuation and airspace consolidation is identified within both upper lobes.  A small amount of perihepatic ascites is noted in the upper abdomen.  IMPRESSION: 1. Moderate volume, loculated right pleural effusion which may represent empyema. 2. Moderate simple appearing left pleural effusion. 3. Bilateral lower lobe airspace consolidation and atelectasis with patchy areas of pneumonitis within both upper lobes. Findings are compatible with multifocal pneumonia. 4. Atherosclerotic disease including multi vessel coronary artery calcifications. 5. Ascites.   Electronically Signed   By: Signa Kell M.D.   On: 09/10/2013 16:42   Dg Chest Port 1 View  09/11/2013   CLINICAL DATA:  Followup right-sided pleural effusion  EXAM: PORTABLE CHEST - 1 VIEW  COMPARISON:  09/10/2013  FINDINGS: Cardiac shadow remains enlarged. An endotracheal tube, nasogastric catheter right jugular central line are again seen and stable. A right basilar drainage catheter is seen. Persistent likely loculated density is noted within the right lung base. This likely represents a combination of infiltrate and effusion. Stable changes are noted in the left base with infiltrate and effusion. No new focal abnormality is seen.  IMPRESSION: Stable appearance of the chest when compared with the previous day.   Electronically Signed   By: Alcide Clever M.D.   On: 09/11/2013 09:34   Dg Chest Port 1 View  09/10/2013   CLINICAL DATA:  73 year old female with pleural effusion. Lower lobe consolidation. Acute respiratory failure. Initial encounter.  EXAM: PORTABLE CHEST - 1 VIEW  COMPARISON:  09/09/2013 and earlier.  FINDINGS: Portable AP semi upright view at 0610 hrs. New right pigtail pleural catheter in place. Density at the right lung base not significantly changed. No pneumothorax.  Stable endotracheal tube tip at the level the clavicles. Stable right IJ approach central  line tip at the level of the cavoatrial junction. Stable visualized enteric tube.  The patient is now more rotated to the left. Veiling opacity at the left lung base is stable.  IMPRESSION: 1. Right pigtail pleural catheter placed. No pneumothorax. Density at the right lung base not significantly changed. 2.  Otherwise, stable lines and tubes. 3. Stable left lung base density representing a combination of pleural fluid and consolidation, as seen on CT 10-Sep-2013.   Electronically Signed   By: Augusto Gamble M.D.   On: 09/10/2013 07:54   Dg Chest Port 1 View  09/09/2013   CLINICAL DATA:  Pneumonia  EXAM: PORTABLE CHEST - 1 VIEW  COMPARISON:  CT CHEST W/O CM dated 09/10/2013; DG CHEST 1V PORT dated 2013/09/10; DG CHEST 1V PORT dated 09/10/2013  FINDINGS: Grossly unchanged enlarged cardiac silhouette and mediastinal contours given decreased lung volumes and patient rotation to the left. Stable position of support apparatus. The pulmonary vasculature remains indistinct with cephalization lobe. Grossly unchanged small bilateral effusions with associated bilateral  mid and lower lung heterogeneous/consolidative opacities. Unchanged bones.  IMPRESSION: 1.  Stable positioning of support apparatus.  No pneumothorax. 2. Grossly unchanged findings of pulmonary edema, small bilateral effusions and associated bibasilar mid and lower lung opacities worrisome for multifocal infection as demonstrated on recent chest CT.   Electronically Signed   By: Simonne ComeJohn  Watts M.D.   On: 09/09/2013 08:02   Dg Chest Port 1 View  May 17, 2013   CLINICAL DATA:  Endotracheal tube advanced 2 cm.  EXAM: PORTABLE CHEST - 1 VIEW  COMPARISON:  Earlier today.  FINDINGS: The endotracheal tube is in satisfactory position and slightly lower than previously demonstrated. The remainder to of the examination is unchanged.  IMPRESSION: No significant change in probable bilateral pneumonia, pleural effusions and cardiomegaly.   Electronically Signed   By: Gordan PaymentSteve  Reid  M.D.   On: 0Jan 23, 2015 03:38   Dg Chest Port 1 View  May 17, 2013   CLINICAL DATA:  Line placement.  EXAM: PORTABLE CHEST - 1 VIEW  COMPARISON:  12/30/2009.  FINDINGS: Dense patchy airspace opacity in both mid and lower lung zones. Probable bilateral pleural effusions. Endotracheal tube in satisfactory position. Right jugular catheter tip in the inferior aspect of the superior vena cava. No pneumothorax. Nasogastric tube extending into the stomach. Probable enlarged cardiac silhouette, difficult to assess due to the airspace opacity. Diffuse osteopenia. Stable surgical absence or distortion of the distal clavicles.  IMPRESSION: 1. Dense bilateral airspace opacity, most compatible with pneumonia. 2. Probable bilateral pleural effusions. 3. Probable cardiomegaly.   Electronically Signed   By: Gordan PaymentSteve  Reid M.D.   On: 0Jan 23, 2015 03:33   Ct Image Guided Drainage By Percutaneous Catheter  09/09/2013   CLINICAL DATA:  Respiratory failure, pneumonia, loculated pleural effusion.  EXAM: CT GUIDED RIGHT PLEURAL DRAIN PLACEMENT  ANESTHESIA/SEDATION: Intravenous Fentanyl and Versed were administered as conscious sedation during continuous cardiorespiratory monitoring by the radiology RN, with a total moderate sedation time of 15 minutes.  PROCEDURE: The procedure, risks, benefits, and alternatives were explained to the family. Questions regarding the procedure were encouraged and answered. The family understands and consents to the procedure.  Patient placed in left lateral decubitus positioning. Limited axial scans through the thorax obtained. An appropriate skin entry site was identified.  The right lateral chest wall was prepped with Betadinein a sterile fashion, and a sterile drape was applied covering the operative field. A sterile gown and sterile gloves were used for the procedure. Local anesthesia was provided with 1% Lidocaine.  Under CT fluoroscopic guidance, a 19 gauge percutaneous entry needle was advanced into the  right pleural space. Cloudy greenish 10 fluid returned. Amplatz wire advanced easily, its position confirmed on CT fluoroscopy. Tract was dilated to allow placement of a 14 French pigtail catheter, formed with in the dependent aspect of the effusion. CT confirms appropriate positioning. Approximately 10 mL of slightly cloudy amber fluid were aspirated, sent for routine Gram stain and culture. Catheter was secured externally with 0-Prolene suture and StatLock and placed to a Pleur-Evac at 20 cmH2O suction. The patient tolerated the procedure well.  COMPLICATIONS: None immediate  FINDINGS: Limited scans demonstrate a partially loculated bilateral pleural effusions. Airspace consolidation in both lower lobes with air bronchograms again evident. #14-French pigtail catheter placed in the loculated right pleural effusion.  IMPRESSION: 1. Technically successful CT-guided right pleural drain catheter placement. A sample of the aspirate sent for routine Gram stain and culture.   Electronically Signed   By: Oley Balmaniel  Hassell M.D.   On: 09/09/2013  17:28     Assessment and Plan  73 y/o F with history of medical noncompliance (meds/appts for 2-3 yrs), NICM, PAF, minimal plaque by cath 2011, ongoing tobacco abuse presented to [facility] with SBP 60s, acute respiratory failure and rapid AF s/p emergent DCCV then transferred to [facility]. S/p chest tube for R empyema 5/18. + C diff diagnosed. Pall care consult 5/20 for GOC.  1. Acute ventilator-dependent respiratory failure due to CAP 2. Septic shock 3. Bilateral pneumococcal CAP with R empyema s/p chest tube 09/09/13 4. NSTEMI likely demand ischemia, cath 2011: minimal plaque 5. Nonischemic cardiomyopathy 15-20% 6. Paroxysmal atrial fibillrilation with RVR s/p emergent DCCV at [facility] 09/22/2013 7. Medical noncompliance (discharged from practice due to habitual nonattendance) 8. Hypothyroidism, noncompliant with thyroid replacement, TSH 23 9. Chronic  LBBB 10. Ongoing tobacco use 11. Elevated PT/INR ? Auto-anticoagulation 12. Transaminitis ? Shock liver - improving 13. Morbid obesity Body mass index is 45.6 kg/(m^2). 14. Hypokalemia 15. Thrombocytopenia 16. Severe hypoalbuminemia 1.9 17. C diff colitis 18. Mild AS by echo 5/19, may be underestimated  Currently maintaining predominantly NSR on amiodarone - has been transitioned to oral via tube. BP remains tenuous requiring levophed. Unable to titrate CHF meds or diurese due to hypotension. Predominant problem felt to be sepsis/PNA - on abx per PCCM. May need to trend INR given concomitant heparin use for AF (s/p DCCV) and possible shock liver - LFTs improved. Will add INR in AM. She is failing SBTs so remains on vent. Will defer lyte management to primary team. 2D Echo reveals known LV dysfunction with diffuse HK, akinesis of anteroseptal and apical myocardium, mild AS (may be underestimated secondary to decreased EF). Prognosis appears poor. Palliative care note reports family goals to continue present medical treatment plan without CPR or cardioversion. Further discussions are apparently pending this afternoon.  Signed, Ronie Spies PA-C   I have examined the patient and reviewed assessment and plan and discussed with patient.  Agree with above as stated.  LV dysfunction with respiratory failure. Palliative care evaluation in progress.  Corky Crafts

## 2013-09-12 NOTE — Progress Notes (Signed)
[facility] ADULT ICU REPLACEMENT PROTOCOL FOR AM LAB REPLACEMENT ONLY  The patient does apply for the [facility] Adult ICU Electrolyte Replacment Protocol based on the criteria listed below:   1. Is GFR >/= 40 ml/min? yes  Patient's GFR today is >90 2. Is urine output >/= 0.5 ml/kg/hr for the last 6 hours? yes Patient's UOP is 12 ml/kg/hr 3. Is BUN < 60 mg/dL? yes  Patient's BUN today is 24 4. Abnormal electrolyte(s): K 3.3, Mg 1.5 5. Ordered repletion with: per protocol 6. If a panic level lab has been reported, has the CCM MD in charge been notified? no.   Physician:    Darrick Grinder 09/12/2013 5:15 AM

## 2013-09-12 NOTE — Progress Notes (Signed)
Family meeting held with Palliative - husband and daughter were present.  Prognosis and pt's wishes were discussed.  It was agreed upon that the pt's code status would be DNR, pt would be extubated with no re-intubation, NGT would be discontinued with all oral meds discontinued, IV medications to remain - including levophed.  Pt will be supported with 2 to 6l oxygen and levophed overnight.  This will be re-evaluated tomorrow.  Pt was extubated at 1605 and is on 6l n/c with family at bedside.  Comfort orders were placed by Palliative.  Palliative notified CCM of change in code status.

## 2013-09-12 NOTE — Progress Notes (Signed)
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   Patient is currently intubated on ventilator support MV: 6.4 L/min Temp (24hrs), Avg:98.5 F (36.9 C), Min:97.9 F (36.6 C), Max:99.3 F (37.4 C)   Vital HP formula currently infusing via NGT at goal rate of 35 ml/hr with Prostat liquid protein 30 ml TID providing 1140 kcals (71% of re-estimated kcal needs), 118 gm protein (100% of estimated protein needs), 702 ml of free water.  Tolerating well.  No gastric residuals.  Palliative Care Team note 5/20 reviewed.  CCM note 5/21 AM reviewed.  Height: Ht Readings from Last 1 Encounters:  09/07/2013 5' (1.524 m)    Weight: Wt Readings from Last 1 Encounters:  09/12/13 240 lb 11.9 oz (109.2 kg)    BMI:  Body mass index is 47.02 kg/(m^2).  Estimated Nutritional Needs: Kcal: 1600 Protein: 115-125 gm Fluid: per MD  Skin: intertriginous skin damage from moisture to bilateral groin & under the pannus  Diet Order: NPO   Intake/Output Summary (Last 24 hours) at 09/12/13 1131 Last data filed at 09/12/13 1000  Gross per 24 hour  Intake 2166.9 ml  Output   2005 ml  Net  161.9 ml    Labs:   Recent Labs Lab 09/10/13 0444 09/11/13 0420 09/12/13 0340  NA 136* 136* 137  K 3.3*  3.4* 3.3*  CL 100 100 98  CO2 $Re'24 26 28  'HUJ$ BUN 30* 26* 24*  CREATININE 0.73 0.66 0.56  CALCIUM 7.5* 7.2* 7.3*  MG 1.7 1.6 1.5  PHOS 2.9 2.8 3.3  GLUCOSE 131* 119* 129*    CBG (last 3)   Recent Labs  09/11/13 2335 09/12/13 0339 09/12/13 0739  GLUCAP 116* 109* 123*    Scheduled Meds: . alteplase  10 mg Intracatheter Once  . amiodarone  400 mg Per Tube Daily  . antiseptic oral rinse  15 mL Mouth Rinse QID  . cefTRIAXone (ROCEPHIN)  IV  2 g Intravenous Q24H  . chlorhexidine  15 mL Mouth Rinse BID  . docusate  100 mg Oral Daily  . enoxaparin (LOVENOX) injection  105 mg Subcutaneous Q12H  . feeding supplement (PRO-STAT SUGAR FREE 64)  30 mL Per Tube TID  . feeding supplement (VITAL HIGH PROTEIN)  1,000 mL Per Tube Q24H  . insulin aspart  2-6 Units Subcutaneous 6 times per day  . levothyroxine  150 mcg Oral QAC breakfast  . magnesium sulfate LVP 250-500 ml  6 g Intravenous Once  . metroNIDAZOLE  500 mg Oral 3 times per day  . multivitamin  5 mL Per Tube Daily  . nystatin cream   Topical TID  . pantoprazole sodium  40 mg Per Tube Q1200  . pneumococcal 23 valent vaccine  0.5 mL Intramuscular Tomorrow-1000  . sodium chloride  10-40 mL Intracatheter Q12H  . vancomycin  125 mg Oral  QID    Continuous Infusions: . sodium chloride 10 mL/hr at 09/11/13 1900  . fentaNYL infusion INTRAVENOUS 100 mcg/hr (09/12/13 0600)  . norepinephrine (LEVOPHED) Adult infusion 12 mcg/min (09/12/13 1000)    Past Medical History  Diagnosis Date  . Atrial fibrillation with rapid ventricular response   . LBBB (left bundle branch block)     Chronic  . History of chest pain     a.  2011 Cath:  Minimal Coronary Plaque  . Hiatal hernia   . GERD (gastroesophageal reflux disease)   . Asthma   . Current tobacco use     Currently smoking 1 pack per day  . DJD (degenerative joint disease)   . Spinal stenosis     At L5-S1  . Chronic back pain   . Osteoarthritis   . Anxiety   . Hypertension   .  Hyperlipidemia   . Colon polyps   . Hypothyroidism   . Fibromyalgia   . History of headache   . Cardiomyopathy 2011    a. Possibly related to Afib with RVR;  b. 12/2009 Echo: EF 20-25%, anteroseptal, apical, inferior AK. Mild MR. Mildly dilated LA.  Marland Kitchen Pleural effusion 09/10/2013    Bilateral, R>L   . Morbid obesity 09/09/2013  . History of noncompliance with medical treatment, presenting hazards to health 09/09/2013  . NICM (nonischemic cardiomyopathy) - cath 2011 with minimal plaque 09/09/2013  . NSTEMI (non-ST elevated myocardial infarction) likely due to demand ischemia 09/09/2013  . Pneumococcal lobar pneumonia 09/07/2013  . Acute respiratory failure 09/09/2013  . Acute on chronic respiratory failure 08/30/2013  . Acute on chronic systolic heart failure 6/57/8469  . Septic shock(785.52) 09/15/2013  . Paroxysmal atrial fibrillation 01/15/2010    Qualifier: Diagnosis of  By: Lovette Cliche, CNA, Christy    . TOBACCO ABUSE 01/15/2010    Qualifier: Diagnosis of  By: Lovette Cliche, CNA, Christy     . LBBB 01/15/2010    Qualifier: Diagnosis of  By: Lovette Cliche, CNA, Christy    . GERD 01/15/2010    Qualifier: Diagnosis of  By: Lovette Cliche, CNA, Christy    . BACK PAIN, CHRONIC 01/15/2010    Qualifier: Diagnosis of  By: Lovette Cliche, CNA, Christy    . ANXIETY 01/15/2010    Qualifier: Diagnosis of  By: Lovette Cliche, CNA, Christy    . HYPERLIPIDEMIA 01/15/2010    Qualifier: Diagnosis of  By: Gentry Roch      Past Surgical History  Procedure Laterality Date  . Tonsillectomy and adenoidectomy      As a child  . Tubal ligation    . Partial hysterectomy    . Bladder tack    . Rotator cuff repair      Right  . 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 Lumbar laminectomy/decompression microdiscectomy      Lumbar decompression  . Total knee arthroplasty  2002    Left  . Cervical discectomy      Details unknown    Arthur Holms, RD, LDN Pager #: [phone] After-Hours Pager #: [phone]

## 2013-09-12 NOTE — Progress Notes (Signed)
eLink Physician-Brief Progress Note Patient Name: Cathy Robles DOB: 02/28/1941 MRN: [id number]  Date of Service  09/12/2013   HPI/Events of Note     eICU Interventions  Add trazadone for sleep aid   Intervention Category Minor Interventions: Agitation / anxiety - evaluation and management  Lupita Leash 09/12/2013, 8:19 PM

## 2013-09-13 ENCOUNTER — Inpatient Hospital Stay (HOSPITAL_COMMUNITY): Payer: Managed Care, Other (non HMO)

## 2013-09-13 DIAGNOSIS — K1370 Unspecified lesions of oral mucosa: Secondary | ICD-10-CM

## 2013-09-13 DIAGNOSIS — K117 Disturbances of salivary secretion: Secondary | ICD-10-CM

## 2013-09-13 DIAGNOSIS — R0600 Dyspnea, unspecified: Secondary | ICD-10-CM

## 2013-09-13 LAB — GLUCOSE, CAPILLARY
GLUCOSE-CAPILLARY: 81 mg/dL (ref 70–99)
Glucose-Capillary: 102 mg/dL — ABNORMAL HIGH (ref 70–99)
Glucose-Capillary: 78 mg/dL (ref 70–99)

## 2013-09-13 LAB — BODY FLUID CULTURE
CULTURE: NO GROWTH
CULTURE: NO GROWTH
Gram Stain: NONE SEEN

## 2013-09-13 MED ORDER — METRONIDAZOLE IN NACL 5-0.79 MG/ML-% IV SOLN
500.0000 mg | Freq: Three times a day (TID) | INTRAVENOUS | Status: DC
  Administered 2013-09-13 – 2013-09-15 (×6): 500 mg via INTRAVENOUS
  Filled 2013-09-13 (×8): qty 100

## 2013-09-13 MED ORDER — WHITE PETROLATUM GEL
Status: DC | PRN
  Filled 2013-09-13: qty 5

## 2013-09-13 MED ORDER — SCOPOLAMINE 1 MG/3DAYS TD PT72
1.0000 | MEDICATED_PATCH | TRANSDERMAL | Status: DC
  Administered 2013-09-13: 1.5 mg via TRANSDERMAL
  Filled 2013-09-13 (×2): qty 1

## 2013-09-13 NOTE — Progress Notes (Signed)
IR asked to remove right chest tube. Successful removal of right pigtail chest tube. No immediate complications, sterile dressing placed.  Pattricia Boss PA-C Interventional Radiology  09/13/13  3:36 PM

## 2013-09-13 NOTE — Progress Notes (Signed)
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   Admitted in 2011 with respiratory failure and CHF in setting of AF with RVR. Echo Ef 25%. Underwent cath. Normal coronary arteries EF 40%. Underwent DC-CV. Placed on amio and Pradaxa. Last saw Dr. Antoine PocheHochrein in 2012 and was in NSR, dc'd from cardiology due to repeated no-shows.  Daughter came to visit 5/17 and was severely dyspneic and called 911. [facility]. Was hypotensive (SBP 60s) and in extreme respiratory distress as well as AF with RVR.  Intubated in ER. CXR with bilateral infiltrates PNA vs CHF. WBC 23K. BNP 1110  Underwent emergent DC-CV of AF. Started on neo without much benefit. Levophed added in ambulance.  Bedside echo LVEF 15-20%. RV normal.     LINES / TUBES: RIJ CVL 2013/04/27 >>> R radial arterial line 2013/04/27>>> ETT 2013/04/27 ([facility]) >>> 5/21 Rt chest tube (IR) 5/18 >>5/22  CULTURES: BCX x 2 (2013/04/27) >>>ng Sputum CX >>neg UA (2013/04/27 >> [facility] ....... Urine strep - POSITIVE Urine leg neg resp virus pcr tracheal aspirate  Pl fluid rt >> ng C diff pCR 5/19 POS  ANTIBIOTICS: Vancomycin 2013/04/27 >>> Cefipime 08/29/13 >>>5/17, CEftriaxone 5/17 >> PO vanc 5/20 >> 5/21 PO flagyl >>5/21 IV flagyl 5/22  Levofloxacin x 1 [facility] 08/29/13 >>> d/c'd due to reported allergy  PCN alllergy (hives) on chart   SIGNIFICANT EVENTS / STUDIES:  Intubated 2013/04/27 [facility] 2013/04/27 Rt chest tube (IR)  5/18 Duplex LUE neg 5/18 5/20 tpa & pulmozyme into pleural space -drained 260 cc 5/21 palliative care discussion - extubated    SUBJECTIVE/OVERNIGHT/INTERVAL HX Off  levophed  Afebrile Not in pain, int agitation Chest tube- 30 cc drainage   VITAL SIGNS: Filed Vitals:   09/13/13 1145 09/13/13 1200 09/13/13 1242 09/13/13 1300  BP: 134/76 103/61  124/92  Pulse: 74   75  Temp:   97.6 F (36.4 C)   TempSrc:   Oral   Resp: 23 18  26   Height:      Weight:      SpO2: 94%   93%      HEMODYNAMICS:   VENTILATOR SETTINGS: Vent Mode:  [-] PRVC FiO2 (%):  [30 %] 30 % Set Rate:  [28 bmp] 28 bmp Vt Set:  [420 mL] 420 mL PEEP:  [5 cmH20] 5 cmH20 Plateau Pressure:  [4 cmH20-21 cmH20] 20 cmH20 INTAKE / OUTPUT: I/O last 3 completed shifts: In: 2111.3 [I.V.:1301.3; NG/GT:760; IV Piggyback:50] Out: 2300 [Urine:2050; Chest Tube:250]   PHYSICAL EXAMINATION: General:  Chronically and critically ill appearing.  HEENT:  poor dentition  Neck: supple. Thick.noJVD. Carotids no bruits. No lymphadenopathy or thryomegaly appreciated. Cor: PMI nonpalpable. Regular rate & rhythm. Distant HS No obvious rubs, gallops or murmurs. Fungal rash on chest -improved Lungs: + rhonchi, rt chest tube serosanguinous fluid Abdomen: obese soft, nontender, nondistended. No hepatosplenomegaly. No bruits or masses. Good bowel sounds. Extremities: no cyanosis, clubbing, rash. Cool trace edema Neuro: confused, Follows commands. Nonfocal, RASS 0  to +1  LABS: PULMONARY  Recent Labs Lab 09/25/13 0354 Sep 25, 2013 0946 09/09/13 0401 09/10/13 0448 09/11/13 0500  PHART 7.191* 7.352 7.357  --   --   PCO2ART 47.1* 36.9 37.8  --   --   PO2ART 125.0* 144.0* 87.2  --   --   HCO3 17.4* 20.7 20.7  --   --   TCO2 18.8 22 21.8  --   --   O2SAT 96.1 99.0 67.0  94.9 65.0 65.4    CBC  Recent Labs Lab 09/10/13 0444 09/11/13 0420 09/12/13 0340  HGB 11.2* 10.4* 10.4*  HCT 35.8* 33.7* 34.3*  WBC 16.3* 14.3* 17.9*   PLT 106* 92* 109*    COAGULATION  Recent Labs Lab 09/09/13 0400 09/10/13 0444  INR 1.62* 1.53*    CARDIAC    Recent Labs Lab 09-25-13 0309 09/25/2013 0907 09-25-2013 1940 09/09/13 0400  TROPONINI <0.30 0.70* 0.44* 0.31*    Recent Labs Lab 25-Sep-2013 0343 09/09/13 0400  PROBNP 10714.0* 6702.0*     CHEMISTRY  Recent Labs Lab 2013/09/25 1435 09/09/13 0400 09/10/13 0444 09/11/13 0420 09/12/13 0340  NA 134* 133* 136* 136* 137  Robles 4.1 3.6* 3.3* 3.4* 3.3*  CL 99 97 100 100 98  CO2 19 20 24 26 28   GLUCOSE 120* 104* 131* 119* 129*  BUN 50* 42* 30* 26* 24*  CREATININE 1.07 0.93 0.73 0.66 0.56  CALCIUM 7.7* 7.8* 7.5* 7.2* 7.3*  MG  --  1.9 1.7 1.6 1.5  PHOS  --  2.6 2.9 2.8 3.3   Estimated Creatinine Clearance: 73 ml/min (by C-G formula based on Cr of 0.56).   LIVER  Recent Labs Lab 09-25-2013 0300 09/09/13 0400 09/10/13 0444 09/11/13 0420 09/12/13 0340  AST 678* 266* 147* 106* 73*  ALT 495* 369* 241* 168* 124*  ALKPHOS 89 91 105 92 77  BILITOT 1.2 0.7 0.5 0.5 0.5  PROT 5.1* 5.2* 5.0* 5.1* 5.2*  ALBUMIN 2.1* 2.1* 1.9* 1.7* 1.7*  INR  --  1.62* 1.53*  --   --      INFECTIOUS  Recent Labs Lab 09/25/13 0308  09/25/13 1320 09/09/13 0400 09/10/13 0444  LATICACIDVEN 2.5*  --   --  1.4  --   PROCALCITON  --   < > 1.34 1.07 0.52  < > = values in this interval not displayed.   ENDOCRINE CBG (last 3)   Recent Labs  09/12/13 2354 09/13/13 0351 09/13/13 0752  GLUCAP 102* 81 78      IMAGING x48h  Dg Chest Port 1 View  09/13/2013   CLINICAL DATA:  Right-sided empyema  EXAM: PORTABLE CHEST - 1 VIEW  COMPARISON:  09/11/2013  FINDINGS: A drainage catheter is again noted overlying the right lung field. The degree of pleural fluid seen bilaterally is stable. Bibasilar infiltrates are again noted. The overall appearance is stable from the prior study. Increased density is noted over the left hemi thorax which may be related to a posteriorly layering  effusion. This may be positional in nature. A right-sided central venous line is again seen and stable. The endotracheal tube and nasogastric catheter have been removed.  IMPRESSION: Stable bibasilar changes with right chest tube. There appears to be some slight increase in pleural fluid particularly on the left although this may be related to positioning.   Electronically Signed   By: Alcide Clever M.D.   On: 09/13/2013 08:16       ASSESSMENT / PLAN:  PULMONARY A: 1) VDRF likely  due to severe pneumococal CAP -> septic shock,      2) Acute/chronic systolic HF d/t NICM EF 15-20% Rt loculated effusion vs empyema s/p IR chest tube -VATs (poor candidate in my opinion) - appreciate TCTS input  P:   extubated    CARDIOVASCULAR A: 1) PAF with RVR - s/p emergent DC-CV at [facility] 10/06/2013      2) Acute/chronic systolic HF d/t NICM EF 15-20%      3) LBBB  4) [facility]side [facility] - ef 15% QTc on adm >525msec P:  dc amio  Change to lovenox.  Off Levophed gtt Diurese if able   RENAL A: At risk for ATN Hypokalmeia P Monitor, replete Robles   GI A NPO P Off  tube feeds  ID A:  1) Septic shock       2) CAP - severe pneumococcal CAP + Rt empyema       3) PCN and levofloxacin allergy  -MRSA PCR positive C diff colitis  P:   Ct ceftx Dc vanc Ct flagyl IV   Heme - plts drop from 144k on adm, now rebounding Stop heparin if < 70k   ENDOCRINE  A:  1) h/o hypothyroidism - non compliant, TSH 23 P:     Resume synthroid 150    DERM A: 1) Fungal rash P: Nystatin Diflucan 100 mg  X 7ds   CODE STATUS:  According to family patient would not want life support. However daughter felt otherwise , hence intubated Appreciate Palliative care input for goals of care -DNR issued, OK to transfer to floor   The patient is critically ill with multiple organ systems failure and requires high complexity decision making for assessment and support, frequent evaluation and titration of  therapies, application of advanced monitoring technologies and extensive interpretation of multiple databases.   Critical Care Time devoted to patient care services described in this note is  31 Minutes.   Cyril Mourning MD. Tonny Bollman. [facility] Pager [phone] If no response call [phone]   09/13/2013 3:09 PM

## 2013-09-13 NOTE — Progress Notes (Signed)
Restarted Norepinephrine drip at 10 mcg/min.

## 2013-09-13 NOTE — Plan of Care (Signed)
Problem: Phase II Progression Outcomes Goal: Wean O2 if indicated Outcome: Completed/Met Date Met:  09/13/13 Extubated on 09/12/13 and is on 6L/min. Goal: Discharge plan established Outcome: Progressing Palliative care team working with family. She is now a DNR and will be moving to 4N or 6N. On fentanyl drip and prn ativan for comfort. Goal: Tolerating diet Outcome: Progressing Sips clear liquid and ice chips.

## 2013-09-13 NOTE — Progress Notes (Signed)
Progress Note from the Palliative Medicine Team at [facility]  Subjective:   -continued conversation with family regarding diagnosis, prognosis, GOC and options, values important to the patient a and family were attempted to be illicited.  Natural trajectory and expectations at EOL were discussed  (daughter Cathy Robles is present today along with Cathy Robles the patient's husband)  -Family both in agreement that comfort is main focus of care,  "we just want her to ease on out".   -de-escalate care, no further lab draws, diagnostics -symptom management   Family is hopeful to transition from ICU to a room where family can best be present.  We discussed that once chest tube is out consider moving to 4N or 6N   Objective: Allergies  Allergen Reactions  . Aspirin     Pt intubated (08/2013) and unable to verbalize reaction, but affirms allergy (so does family)  . Codeine     REACTION: itch with large doses  . Iodine Itching    Felt hot  . Levofloxacin     REACTION: itch  . Meperidine Hcl     REACTION: nausea  . Methadone     REACTION: ? confusion  . Penicillins     REACTION: hives  . Sulfonamide Derivatives     REACTION: nausea,vomiting,itching   Scheduled Meds: . alteplase  10 mg Intracatheter Once  . antiseptic oral rinse  15 mL Mouth Rinse QID  . cefTRIAXone (ROCEPHIN)  IV  2 g Intravenous Q24H  . chlorhexidine  15 mL Mouth Rinse BID  . nystatin cream   Topical TID  . pantoprazole sodium  40 mg Per Tube Q1200  . pneumococcal 23 valent vaccine  0.5 mL Intramuscular Tomorrow-1000  . sodium chloride  10-40 mL Intracatheter Q12H   Continuous Infusions: . sodium chloride 10 mL/hr at 09/11/13 1900  . fentaNYL infusion INTRAVENOUS 75 mcg/hr (09/13/13 0600)  . norepinephrine (LEVOPHED) Adult infusion Stopped (09/13/13 1155)   PRN Meds:.fentaNYL, fentaNYL, LORazepam, sodium chloride  BP 103/61  Pulse 74  Temp(Src) 97.5 F (36.4 C) (Axillary)  Resp 18  Ht 5' (1.524 m)   Wt 108.4 kg (238 lb 15.7 oz)  BMI 46.67 kg/m2  SpO2 94%   PPS:20 % at best     Intake/Output Summary (Last 24 hours) at 09/13/13 1219 Last data filed at 09/13/13 1200  Gross per 24 hour  Intake 628.85 ml  Output   1325 ml  Net -696.15 ml      LBM: rectal tube      Physical Exam:  General:  chronically ill appearing, awakens to name HEENT:  Dry buccal  Membranes, Chest:  scattered coarse BS, audible throat secretions CVS: RRR Abdomen:soft NT +BS Ext: BUE with +2 edema Neuro: follows simple commands, more lethargic today  Labs: CBC    Component Value Date/Time   WBC 17.9* 09/12/2013 0340   RBC 3.62* 09/12/2013 0340   HGB 10.4* 09/12/2013 0340   HCT 34.3* 09/12/2013 0340   PLT 109* 09/12/2013 0340   MCV 94.8 09/12/2013 0340   MCH 28.7 09/12/2013 0340   MCHC 30.3 09/12/2013 0340   RDW 17.6* 09/12/2013 0340   LYMPHSABS 1.1 10/04/2013 0300   MONOABS 1.1* 10-04-13 0300   EOSABS 0.0 10/04/13 0300   BASOSABS 0.0 04-Oct-2013 0300    BMET    Component Value Date/Time   NA 137 09/12/2013 0340   K 3.3* 09/12/2013 0340   CL 98 09/12/2013 0340   CO2 28 09/12/2013 0340   GLUCOSE 129*  09/12/2013 0340   BUN 24* 09/12/2013 0340   CREATININE 0.56 09/12/2013 0340   CALCIUM 7.3* 09/12/2013 0340   GFRNONAA >90 09/12/2013 0340   GFRAA >90 09/12/2013 0340    CMP     Component Value Date/Time   NA 137 09/12/2013 0340   K 3.3* 09/12/2013 0340   CL 98 09/12/2013 0340   CO2 28 09/12/2013 0340   GLUCOSE 129* 09/12/2013 0340   BUN 24* 09/12/2013 0340   CREATININE 0.56 09/12/2013 0340   CALCIUM 7.3* 09/12/2013 0340   PROT 5.2* 09/12/2013 0340   ALBUMIN 1.7* 09/12/2013 0340   AST 73* 09/12/2013 0340   ALT 124* 09/12/2013 0340   ALKPHOS 77 09/12/2013 0340   BILITOT 0.5 09/12/2013 0340   GFRNONAA >90 09/12/2013 0340   GFRAA >90 09/12/2013 0340     Assessment and Plan:  1. Code Status:  DNR/DNI-comfort is main focus of care   -Family both in agreement that comfort is main focus of care,  "we just  want her to ease on out".   -de-escalate care, no further lab draws, diagnostics, CBGs -symptom management -titrate gtt to comfort,   Family is hopeful to transition from ICU to a room where family can best be present.  We discussed that once chest tube is out consider moving to 4N or 6N     2. Symptom Control: Pain/Dyspnea  Continue continuous  Fentanyl gtt, may titrate to comfort          Agitation  Ativan 1 mg IV every 4 hrs prn          Terminal secretions: Scopolamine patch  3. Psycho/Social:  Emotional support offered to daughter and husband.  Both verbalize understanding of limited prognosis.  Hope is for comfort, quality and dignity  4.  Disposition: Transition to 4N or 6N once chest tube is out.  Expect hospital death.  Prognosis is likley hrs to days   Patient Documents Completed or Given: Document Given Completed  Advanced Directives Pkt    MOST yes   DNR    Gone from [facility]    Hard Choices  yes    Time In Time Out Total Time Spent with Patient Total Overall Time  1130 1230 60 min 60 min    Greater than 50%  of this time was spent counseling and coordinating care related to the above assessment and plan.  Lorinda CreedMary Larach NP  Palliative Medicine Team Team Phone # [phone] Pager [phone]  Discussed with Dr Vassie LollAlva 1

## 2013-09-14 DIAGNOSIS — J189 Pneumonia, unspecified organism: Secondary | ICD-10-CM

## 2013-09-14 DIAGNOSIS — E039 Hypothyroidism, unspecified: Secondary | ICD-10-CM

## 2013-09-14 DIAGNOSIS — I447 Left bundle-branch block, unspecified: Secondary | ICD-10-CM

## 2013-09-14 LAB — CULTURE, BLOOD (ROUTINE X 2)
CULTURE: NO GROWTH
CULTURE: NO GROWTH

## 2013-09-14 NOTE — Progress Notes (Signed)
[facility]                                                                              Patient Demographics  Cathy Robles, is a 73 y.o. female, DOB - March 11, 1941, EKC:[id number]  Admit date - 08/23/2013   Admitting Physician Doree Fudge, MD  Outpatient Primary MD for the patient is Chevis Pretty, FNP  LOS - 6   No chief complaint on file.  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  Admitted in 2011 with respiratory failure and CHF in setting of AF with RVR. Echo Ef 25%. Underwent cath. Normal coronary arteries EF 40%. Underwent DC-CV. Placed on amio and Pradaxa. Last saw Dr. Percival Spanish in 2012 and was in NSR, dc'd from cardiology due to repeated no-shows.  Daughter came to visit 5/17 and was severely dyspneic and called 911. Taken to [facility]. Was hypotensive (SBP 60s) and in extreme respiratory distress as well as AF with RVR. Intubated in ER. CXR with bilateral infiltrates PNA vs CHF. WBC 23K. BNP 1110 Underwent emergent DC-CV of AF. Started on neo without much benefit. Levophed added in ambulance. Bedside echo LVEF 15-20%. RV normal.    Assessment & Plan   Patient critically ill with multisystem organ failure.  Patient is still on IV antibiotics. Palliative care has seen patient met with the family, family opted for comfort care with IV antibiotics. We'll continue scopolamine patch, IV fluids, fentanyl drip, Ativan as needed.  Septic shock secondary to community-acquired pneumonia with right empyema/ C. difficile -Patient no longer on pressors -Made comfort care, patient underwent that and chest tube was placed and then removed -Continues to be on metronidazole and ceftriaxone -Patient did require ventilator however extubated and made comfort  care  Paroxysmal fibrillation with RVR -Patient underwent emergent DC cardioversion at [facility] on 09/08/2048 -Was on amiodarone however discontinued  Acute on chronic systolic heart failure/left bundle branch block -EF of 15-20% -Bedside echo: Shows an EF of 15%  Thrombocytopenia -Patient had platelet dropped from 144,000 -Possible HIT, heparin was discontinued  Hypothyroidism -TSH 23 -Was initially placed on Synthroid, however discontinued  Fungal Rash -Continue nystatin and Diflucan  Code Status: DNR/DNI, comfort care  Family Communication: Husband and daughter at bedside  Disposition Plan: Admitted  Time Spent in minutes   30 minutes  Procedures/Significant Events  Intubated 09/20/2013  DC-CV of AF at [facility] 09/17/2013  Rt chest tube (IR) 5/18  Duplex LUE neg 5/18  5/20 tpa & pulmozyme into pleural space -drained 260 cc  5/21 palliative care discussion - extubated 5/22 Chest tube removed by IR  Consults   PCCM Cardiothoracic surgery Cardiology Palliative Care Interventional Radiology  DVT Prophylaxis none  Lab Results  Component Value Date   PLT 109* 09/12/2013    Medications  Scheduled Meds: . cefTRIAXone (ROCEPHIN)  IV  2 g Intravenous Q24H  . metronidazole  500 mg Intravenous Q8H  . nystatin cream   Topical TID  . pneumococcal 23 valent vaccine  0.5  mL Intramuscular Tomorrow-1000  . scopolamine  1 patch Transdermal Q72H  . sodium chloride  10-40 mL Intracatheter Q12H   Continuous Infusions: . sodium chloride 10 mL/hr at 09/11/13 1900  . fentaNYL infusion INTRAVENOUS 10 mcg/hr (09/14/13 0053)   PRN Meds:.fentaNYL, LORazepam, sodium chloride, white petrolatum  Antibiotics    Anti-infectives   Start     Dose/Rate Route Frequency Ordered Stop   09/13/13 1530  metroNIDAZOLE (FLAGYL) IVPB 500 mg     500 mg 100 mL/hr over 60 Minutes Intravenous Every 8 hours 09/13/13 1508     09/11/13 1100  metroNIDAZOLE (FLAGYL) tablet 500 mg  Status:   Discontinued     500 mg Oral 3 times per day 09/11/13 1032 09/12/13 1614   09/11/13 1100  vancomycin (VANCOCIN) 50 mg/mL oral solution 125 mg  Status:  Discontinued     125 mg Oral 4 times daily 09/11/13 1032 09/12/13 1614   09/04/2013 1800  ceFEPIme (MAXIPIME) 1 g in dextrose 5 % 50 mL IVPB  Status:  Discontinued     1 g 100 mL/hr over 30 Minutes Intravenous Every 12 hours 08/23/2013 0623 08/30/2013 1334   09/09/2013 1400  cefTRIAXone (ROCEPHIN) 2 g in dextrose 5 % 50 mL IVPB     2 g 100 mL/hr over 30 Minutes Intravenous Every 24 hours 09/07/2013 1334     09/01/2013 1400  azithromycin (ZITHROMAX) 500 mg in dextrose 5 % 250 mL IVPB  Status:  Discontinued     500 mg 250 mL/hr over 60 Minutes Intravenous Every 24 hours 09/20/2013 1334 09/07/2013 1349   09/11/2013 0800  vancomycin (VANCOCIN) IVPB 750 mg/150 ml premix  Status:  Discontinued     750 mg 150 mL/hr over 60 Minutes Intravenous Every 12 hours 08/29/2013 0623 09/11/13 0932   08/30/2013 0330  ceFEPIme (MAXIPIME) 2 g in dextrose 5 % 50 mL IVPB     2 g 100 mL/hr over 30 Minutes Intravenous  Once 09/04/2013 0321 09/10/2013 0415      Subjective:   Varonica Dobesh seen and examined today.  Patient appears to be comfortable with family at bedside.  Objective:   Filed Vitals:   09/13/13 1200 09/13/13 1242 09/13/13 1300 09/14/13 0602  BP: 103/61  124/92 130/80  Pulse:   75 88  Temp:  97.6 F (36.4 C)  97.8 F (36.6 C)  TempSrc:  Oral  Axillary  Resp: $Remo'18  26 28  'dPnGg$ Height:      Weight:      SpO2:   93% 93%    Wt Readings from Last 3 Encounters:  09/13/13 108.4 kg (238 lb 15.7 oz)  03/23/11 97.523 kg (215 lb)  06/02/10 98.884 kg (218 lb)     Intake/Output Summary (Last 24 hours) at 09/14/13 0829 Last data filed at 09/14/13 0603  Gross per 24 hour  Intake 326.47 ml  Output   1305 ml  Net -978.53 ml    Exam  General: Well developed, ill appearing   HEENT: NCAT, dry mucous membranes  Neck: Supple, no JVD, no masses  Cardiovascular: S1 S2  auscultated, Regular rate and rhythm.  Respiratory: coarse breath sounds  Abdomen: Soft, obese, nontender, nondistended, + bowel sounds  Extremities: warm dry without cyanosis clubbing. +edema in UE B/L   Data Review   Micro Results Recent Results (from the past 240 hour(s))  CULTURE, BLOOD (ROUTINE X 2)     Status: None   Collection Time    09/20/2013  3:09 AM  Result Value Ref Range Status   Specimen Description BLOOD CENTRAL LINE   Final   Special Requests RIGHT IJ BOTTLES DRAWN AEROBIC ONLY [facility]   Final   Culture  Setup Time     Final   Value: 08/26/2013 14:07     Performed at [facility]   Culture     Final   Value:        BLOOD CULTURE RECEIVED NO GROWTH TO DATE CULTURE WILL BE HELD FOR 5 DAYS BEFORE ISSUING A FINAL NEGATIVE REPORT     Performed at [facility]   Report Status PENDING   Incomplete  MRSA PCR SCREENING     Status: Abnormal   Collection Time    08/28/2013  3:14 AM      Result Value Ref Range Status   MRSA by PCR POSITIVE (*) NEGATIVE Final   Comment:            The GeneXpert MRSA Assay (FDA     approved for NASAL specimens     only), is one component of a     comprehensive MRSA colonization     surveillance program. It is not     intended to diagnose MRSA     infection nor to guide or     monitor treatment for     MRSA infections.     RESULT CALLED TO, READ BACK BY AND VERIFIED WITH:     PARRISH,J RN 0502 09/21/2013 MITCHELL,L  CULTURE, RESPIRATORY (NON-EXPECTORATED)     Status: None   Collection Time    09/14/2013  4:00 AM      Result Value Ref Range Status   Specimen Description TRACHEAL ASPIRATE   Final   Special Requests NONE   Final   Gram Stain     Final   Value: FEW WBC PRESENT,BOTH PMN AND MONONUCLEAR     NO SQUAMOUS EPITHELIAL CELLS SEEN     NO ORGANISMS SEEN     Performed at [facility]   Culture     Final   Value: Non-Pathogenic Oropharyngeal-type Flora Isolated.     Performed at [facility]   Report  Status 09/10/2013 FINAL   Final  CULTURE, BLOOD (ROUTINE X 2)     Status: None   Collection Time    09/02/2013 10:38 AM      Result Value Ref Range Status   Specimen Description BLOOD RIGHT HAND   Final   Special Requests BOTTLES DRAWN AEROBIC ONLY 3CC   Final   Culture  Setup Time     Final   Value: 09/05/2013 22:49     Performed at [facility]   Culture     Final   Value:        BLOOD CULTURE RECEIVED NO GROWTH TO DATE CULTURE WILL BE HELD FOR 5 DAYS BEFORE ISSUING A FINAL NEGATIVE REPORT     Performed at [facility]   Report Status PENDING   Incomplete  BODY FLUID CULTURE     Status: None   Collection Time    09/09/13  5:12 PM      Result Value Ref Range Status   Specimen Description PLEURAL FLUID RIGHT   Final   Special Requests 17ML FLUID   Final   Gram Stain     Final   Value: MODERATE WBC PRESENT, PREDOMINANTLY PMN     NO ORGANISMS SEEN     Performed at [facility]  Final   Value: NO GROWTH 3 DAYS     Performed at [facility]   Report Status 09/13/2013 FINAL   Final  BODY FLUID CULTURE     Status: None   Collection Time    09/10/13  2:49 PM      Result Value Ref Range Status   Specimen Description FLUID RIGHT PLEURAL   Final   Special Requests NONE   Final   Gram Stain     Final   Value: NO WBC SEEN     NO ORGANISMS SEEN     Performed at [facility]   Culture     Final   Value: NO GROWTH 3 DAYS     Performed at [facility]   Report Status 09/13/2013 FINAL   Final  CLOSTRIDIUM DIFFICILE BY PCR     Status: Abnormal   Collection Time    09/10/13  6:33 PM      Result Value Ref Range Status   C difficile by pcr POSITIVE (*) NEGATIVE Final   Comment: CRITICAL RESULT CALLED TO, READ BACK BY AND VERIFIED WITH:     Dala Dock RN 10:10 09/11/13 ([email or web address])    Radiology Reports Ct Chest Wo Contrast  09/15/2013   CLINICAL DATA:  Evaluate empyema.  EXAM: CT CHEST WITHOUT CONTRAST  TECHNIQUE: Multidetector CT  imaging of the chest was performed following the standard protocol without IV contrast.  COMPARISON:  08/25/2013  FINDINGS: The heart size is moderately enlarged. There is no pericardial effusion. Calcified atherosclerotic disease involves the thoracic aorta as well as the LAD and RCA coronary arteries. Prominent mediastinal lymph nodes are identified in may be reactive. There is no axillary or supraclavicular adenopathy.  Moderate volume left pleural effusion is identified. A moderate volume of loculated right pleural effusion is also identified. This may represent an empyema. Dense airspace consolidation containing air bronchograms is identified within both lower lobes as well as the right middle lobe. Patchy areas of ground-glass attenuation and airspace consolidation is identified within both upper lobes.  A small amount of perihepatic ascites is noted in the upper abdomen.  IMPRESSION: 1. Moderate volume, loculated right pleural effusion which may represent empyema. 2. Moderate simple appearing left pleural effusion. 3. Bilateral lower lobe airspace consolidation and atelectasis with patchy areas of pneumonitis within both upper lobes. Findings are compatible with multifocal pneumonia. 4. Atherosclerotic disease including multi vessel coronary artery calcifications. 5. Ascites.   Electronically Signed   By: Kerby Moors M.D.   On: 08/25/2013 16:42   Dg Chest Port 1 View  09/13/2013   CLINICAL DATA:  Right-sided chest tube removal  EXAM: PORTABLE CHEST - 1 VIEW  COMPARISON:  09/13/2013  FINDINGS: Right IJ catheter is in stable position, tip at the level of the lower SVC. Interval removal of right basilar pleural drain. No pneumothorax or evidence of increasing pleural fluid.  Unchanged cardiopericardial enlargement. Dense basilar lung opacities are unchanged, with veil like appearance consistent with persistent pleural fluid, left more than right.  IMPRESSION: 1. No pneumothorax or increasing pleural fluid  after right chest tube removal. 2. Unchanged multi lobar pneumonia and bilateral pleural effusion.   Electronically Signed   By: Jorje Guild M.D.   On: 09/13/2013 17:38   Dg Chest Port 1 View  09/13/2013   CLINICAL DATA:  Right-sided empyema  EXAM: PORTABLE CHEST - 1 VIEW  COMPARISON:  09/11/2013  FINDINGS: A drainage catheter is again noted overlying the right lung field.  The degree of pleural fluid seen bilaterally is stable. Bibasilar infiltrates are again noted. The overall appearance is stable from the prior study. Increased density is noted over the left hemi thorax which may be related to a posteriorly layering effusion. This may be positional in nature. A right-sided central venous line is again seen and stable. The endotracheal tube and nasogastric catheter have been removed.  IMPRESSION: Stable bibasilar changes with right chest tube. There appears to be some slight increase in pleural fluid particularly on the left although this may be related to positioning.   Electronically Signed   By: Inez Catalina M.D.   On: 09/13/2013 08:16   Dg Chest Port 1 View  09/11/2013   CLINICAL DATA:  Followup right-sided pleural effusion  EXAM: PORTABLE CHEST - 1 VIEW  COMPARISON:  09/10/2013  FINDINGS: Cardiac shadow remains enlarged. An endotracheal tube, nasogastric catheter right jugular central line are again seen and stable. A right basilar drainage catheter is seen. Persistent likely loculated density is noted within the right lung base. This likely represents a combination of infiltrate and effusion. Stable changes are noted in the left base with infiltrate and effusion. No new focal abnormality is seen.  IMPRESSION: Stable appearance of the chest when compared with the previous day.   Electronically Signed   By: Inez Catalina M.D.   On: 09/11/2013 09:34   Dg Chest Port 1 View  09/10/2013   CLINICAL DATA:  73 year old female with pleural effusion. Lower lobe consolidation. Acute respiratory failure.  Initial encounter.  EXAM: PORTABLE CHEST - 1 VIEW  COMPARISON:  09/09/2013 and earlier.  FINDINGS: Portable AP semi upright view at 0610 hrs. New right pigtail pleural catheter in place. Density at the right lung base not significantly changed. No pneumothorax.  Stable endotracheal tube tip at the level the clavicles. Stable right IJ approach central line tip at the level of the cavoatrial junction. Stable visualized enteric tube.  The patient is now more rotated to the left. Veiling opacity at the left lung base is stable.  IMPRESSION: 1. Right pigtail pleural catheter placed. No pneumothorax. Density at the right lung base not significantly changed. 2.  Otherwise, stable lines and tubes. 3. Stable left lung base density representing a combination of pleural fluid and consolidation, as seen on CT 09/15/2013.   Electronically Signed   By: Lars Pinks M.D.   On: 09/10/2013 07:54   Dg Chest Port 1 View  09/09/2013   CLINICAL DATA:  Pneumonia  EXAM: PORTABLE CHEST - 1 VIEW  COMPARISON:  CT CHEST W/O CM dated 09/02/2013; DG CHEST 1V PORT dated 09/07/2013; DG CHEST 1V PORT dated 09/21/2013  FINDINGS: Grossly unchanged enlarged cardiac silhouette and mediastinal contours given decreased lung volumes and patient rotation to the left. Stable position of support apparatus. The pulmonary vasculature remains indistinct with cephalization lobe. Grossly unchanged small bilateral effusions with associated bilateral mid and lower lung heterogeneous/consolidative opacities. Unchanged bones.  IMPRESSION: 1.  Stable positioning of support apparatus.  No pneumothorax. 2. Grossly unchanged findings of pulmonary edema, small bilateral effusions and associated bibasilar mid and lower lung opacities worrisome for multifocal infection as demonstrated on recent chest CT.   Electronically Signed   By: Sandi Mariscal M.D.   On: 09/09/2013 08:02   Dg Chest Port 1 View  09/11/2013   CLINICAL DATA:  Endotracheal tube advanced 2 cm.  EXAM: PORTABLE  CHEST - 1 VIEW  COMPARISON:  Earlier today.  FINDINGS: The endotracheal tube is in satisfactory  position and slightly lower than previously demonstrated. The remainder to of the examination is unchanged.  IMPRESSION: No significant change in probable bilateral pneumonia, pleural effusions and cardiomegaly.   Electronically Signed   By: Enrique Sack M.D.   On: 09/18/2013 03:38   Dg Chest Port 1 View  09/03/2013   CLINICAL DATA:  Line placement.  EXAM: PORTABLE CHEST - 1 VIEW  COMPARISON:  12/30/2009.  FINDINGS: Dense patchy airspace opacity in both mid and lower lung zones. Probable bilateral pleural effusions. Endotracheal tube in satisfactory position. Right jugular catheter tip in the inferior aspect of the superior vena cava. No pneumothorax. Nasogastric tube extending into the stomach. Probable enlarged cardiac silhouette, difficult to assess due to the airspace opacity. Diffuse osteopenia. Stable surgical absence or distortion of the distal clavicles.  IMPRESSION: 1. Dense bilateral airspace opacity, most compatible with pneumonia. 2. Probable bilateral pleural effusions. 3. Probable cardiomegaly.   Electronically Signed   By: Enrique Sack M.D.   On: 09/18/2013 03:33   Ct Image Guided Drainage By Percutaneous Catheter  09/09/2013   CLINICAL DATA:  Respiratory failure, pneumonia, loculated pleural effusion.  EXAM: CT GUIDED RIGHT PLEURAL DRAIN PLACEMENT  ANESTHESIA/SEDATION: Intravenous Fentanyl and Versed were administered as conscious sedation during continuous cardiorespiratory monitoring by the radiology RN, with a total moderate sedation time of 15 minutes.  PROCEDURE: The procedure, risks, benefits, and alternatives were explained to the family. Questions regarding the procedure were encouraged and answered. The family understands and consents to the procedure.  Patient placed in left lateral decubitus positioning. Limited axial scans through the thorax obtained. An appropriate skin entry site was  identified.  The right lateral chest wall was prepped with Betadinein a sterile fashion, and a sterile drape was applied covering the operative field. A sterile gown and sterile gloves were used for the procedure. Local anesthesia was provided with 1% Lidocaine.  Under CT fluoroscopic guidance, a 19 gauge percutaneous entry needle was advanced into the right pleural space. Cloudy greenish 10 fluid returned. Amplatz wire advanced easily, its position confirmed on CT fluoroscopy. Tract was dilated to allow placement of a 14 French pigtail catheter, formed with in the dependent aspect of the effusion. CT confirms appropriate positioning. Approximately 10 mL of slightly cloudy amber fluid were aspirated, sent for routine Gram stain and culture. Catheter was secured externally with 0-Prolene suture and StatLock and placed to a Pleur-Evac at 20 cmH2O suction. The patient tolerated the procedure well.  COMPLICATIONS: None immediate  FINDINGS: Limited scans demonstrate a partially loculated bilateral pleural effusions. Airspace consolidation in both lower lobes with air bronchograms again evident. #14-French pigtail catheter placed in the loculated right pleural effusion.  IMPRESSION: 1. Technically successful CT-guided right pleural drain catheter placement. A sample of the aspirate sent for routine Gram stain and culture.   Electronically Signed   By: Arne Cleveland M.D.   On: 09/09/2013 17:28    CBC  Recent Labs Lab 09/13/2013 0300 08/24/2013 0500 09/09/13 0400 09/10/13 0444 09/11/13 0420 09/12/13 0340  WBC 22.6* 21.6* 20.1* 16.3* 14.3* 17.9*  HGB 11.5* 11.8* 11.9* 11.2* 10.4* 10.4*  HCT 36.6 38.0 37.3 35.8* 33.7* 34.3*  PLT 144* 128* 121* 106* 92* 109*  MCV 92.9 92.2 91.0 92.7 93.6 94.8  MCH 29.2 28.6 29.0 29.0 28.9 28.7  MCHC 31.4 31.1 31.9 31.3 30.9 30.3  RDW 17.1* 17.2* 17.0* 17.5* 17.7* 17.6*  LYMPHSABS 1.1  --   --   --   --   --  MONOABS 1.1*  --   --   --   --   --   EOSABS 0.0  --   --   --    --   --   BASOSABS 0.0  --   --   --   --   --     Chemistries   Recent Labs Lab 08/28/2013 0300  08/28/2013 1435 09/09/13 0400 09/10/13 0444 09/11/13 0420 09/12/13 0340  NA 132*  < > 134* 133* 136* 136* 137  K 4.4  < > 4.1 3.6* 3.3* 3.4* 3.3*  CL 100  < > 99 97 100 100 98  CO2 15*  < > $R'19 20 24 26 28  'hh$ GLUCOSE 177*  < > 120* 104* 131* 119* 129*  BUN 44*  < > 50* 42* 30* 26* 24*  CREATININE 0.94  < > 1.07 0.93 0.73 0.66 0.56  CALCIUM 7.0*  < > 7.7* 7.8* 7.5* 7.2* 7.3*  MG  --   --   --  1.9 1.7 1.6 1.5  AST 678*  --   --  266* 147* 106* 73*  ALT 495*  --   --  369* 241* 168* 124*  ALKPHOS 89  --   --  91 105 92 77  BILITOT 1.2  --   --  0.7 0.5 0.5 0.5  < > = values in this interval not displayed. ------------------------------------------------------------------------------------------------------------------ estimated creatinine clearance is 73 ml/min (by C-G formula based on Cr of 0.56). ------------------------------------------------------------------------------------------------------------------ No results found for this basename: HGBA1C,  in the last 72 hours ------------------------------------------------------------------------------------------------------------------ No results found for this basename: CHOL, HDL, LDLCALC, TRIG, CHOLHDL, LDLDIRECT,  in the last 72 hours ------------------------------------------------------------------------------------------------------------------ No results found for this basename: TSH, T4TOTAL, FREET3, T3FREE, THYROIDAB,  in the last 72 hours ------------------------------------------------------------------------------------------------------------------ No results found for this basename: VITAMINB12, FOLATE, FERRITIN, TIBC, IRON, RETICCTPCT,  in the last 72 hours  Coagulation profile  Recent Labs Lab 09/09/13 0400 09/10/13 0444  INR 1.62* 1.53*    No results found for this basename: DDIMER,  in the last 72  hours  Cardiac Enzymes  Recent Labs Lab 08/23/2013 0907 08/27/2013 1940 09/09/13 0400  TROPONINI 0.70* 0.44* 0.31*   ------------------------------------------------------------------------------------------------------------------ No components found with this basename: POCBNP,     Sydelle Sherfield D.O. on 09/14/2013 at 8:29 AM  Between 7am to 7pm - Pager - [phone]  After 7pm go to [email or web address] - password [email or web address]  And look for the night coverage person covering for me after hours  [facility] Office  [phone]

## 2013-09-15 DIAGNOSIS — F411 Generalized anxiety disorder: Secondary | ICD-10-CM

## 2013-09-15 DIAGNOSIS — K219 Gastro-esophageal reflux disease without esophagitis: Secondary | ICD-10-CM

## 2013-09-15 DIAGNOSIS — R0989 Other specified symptoms and signs involving the circulatory and respiratory systems: Secondary | ICD-10-CM

## 2013-09-15 DIAGNOSIS — R0609 Other forms of dyspnea: Secondary | ICD-10-CM

## 2013-09-15 DIAGNOSIS — E785 Hyperlipidemia, unspecified: Secondary | ICD-10-CM

## 2013-09-15 MED ORDER — ATROPINE SULFATE 1 % OP SOLN
2.0000 [drp] | OPHTHALMIC | Status: DC | PRN
  Administered 2013-09-15: 2 [drp] via SUBLINGUAL
  Filled 2013-09-15: qty 2

## 2013-09-15 NOTE — Progress Notes (Signed)
[facility]                                                                              Patient Demographics  Cathy Robles, is a 73 y.o. female, DOB - 11/15/1940, ZSW:[id number]  Admit date - 08/30/2013   Admitting Physician Doree Fudge, MD  Outpatient Primary MD for the patient is Chevis Pretty, FNP  LOS - 7   No chief complaint on file.  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  Admitted in 2011 with respiratory failure and CHF in setting of AF with RVR. Echo Ef 25%. Underwent cath. Normal coronary arteries EF 40%. Underwent DC-CV. Placed on amio and Pradaxa. Last saw Dr. Percival Spanish in 2012 and was in NSR, dc'd from cardiology due to repeated no-shows.  Daughter came to visit 5/17 and was severely dyspneic and called 911. Taken to [facility]. Was hypotensive (SBP 60s) and in extreme respiratory distress as well as AF with RVR. Intubated in ER. CXR with bilateral infiltrates PNA vs CHF. WBC 23K. BNP 1110 Underwent emergent DC-CV of AF. Started on neo without much benefit. Levophed added in ambulance. Bedside echo LVEF 15-20%. RV normal.    Assessment & Plan   Patient critically ill with multisystem organ failure.  Palliative care has seen patient met with the family, family opted for comfort care. We'll continue scopolamine patch, IV fluids, fentanyl drip, Ativan as needed.   Septic shock secondary to community-acquired pneumonia with right empyema/ C. difficile -Patient no longer on pressors -Made comfort care, patient underwent that and chest tube was placed and then removed -Will discontinue antibiotics today -Patient did require ventilator however extubated and made comfort care  Paroxysmal fibrillation with RVR -Patient underwent emergent DC  cardioversion at [facility] on 09/08/2048 -Was on amiodarone however discontinued  Acute on chronic systolic heart failure/left bundle branch block -EF of 15-20% -Bedside echo: Shows an EF of 15%  Thrombocytopenia -Patient had platelet dropped from 144,000 -Possible HIT, heparin was discontinued  Hypothyroidism -TSH 23 -Was initially placed on Synthroid, however discontinued  Fungal Rash -Continue nystatin and Diflucan  Code Status: DNR/DNI, comfort care  Family Communication: Husband and daughter at bedside  Disposition Plan: Admitted  Time Spent in minutes   25 minutes  Procedures/Significant Events  Intubated 08/23/2013  DC-CV of AF at [facility] 09/03/2013  Rt chest tube (IR) 5/18  Duplex LUE neg 5/18  5/20 tpa & pulmozyme into pleural space -drained 260 cc  5/21 palliative care discussion - extubated 5/22 Chest tube removed by IR  Consults   PCCM Cardiothoracic surgery Cardiology Palliative Care Interventional Radiology  DVT Prophylaxis none  Lab Results  Component Value Date   PLT 109* 09/12/2013    Medications  Scheduled Meds: . cefTRIAXone (ROCEPHIN)  IV  2 g Intravenous Q24H  . metronidazole  500 mg Intravenous Q8H  . nystatin cream   Topical TID  . pneumococcal 23 valent vaccine  0.5 mL Intramuscular Tomorrow-1000  . scopolamine  1 patch Transdermal  Q72H  . sodium chloride  10-40 mL Intracatheter Q12H   Continuous Infusions: . sodium chloride 10 mL/hr at 09/11/13 1900  . fentaNYL infusion INTRAVENOUS 250 mcg/hr (09/15/13 0740)   PRN Meds:.fentaNYL, LORazepam, sodium chloride, white petrolatum  Antibiotics    Anti-infectives   Start     Dose/Rate Route Frequency Ordered Stop   09/13/13 1530  metroNIDAZOLE (FLAGYL) IVPB 500 mg     500 mg 100 mL/hr over 60 Minutes Intravenous Every 8 hours 09/13/13 1508     09/11/13 1100  metroNIDAZOLE (FLAGYL) tablet 500 mg  Status:  Discontinued     500 mg Oral 3 times per day 09/11/13 1032 09/12/13 1614    09/11/13 1100  vancomycin (VANCOCIN) 50 mg/mL oral solution 125 mg  Status:  Discontinued     125 mg Oral 4 times daily 09/11/13 1032 09/12/13 1614   09/02/2013 1800  ceFEPIme (MAXIPIME) 1 g in dextrose 5 % 50 mL IVPB  Status:  Discontinued     1 g 100 mL/hr over 30 Minutes Intravenous Every 12 hours 09/10/2013 0623 08/24/2013 1334   08/23/2013 1400  cefTRIAXone (ROCEPHIN) 2 g in dextrose 5 % 50 mL IVPB     2 g 100 mL/hr over 30 Minutes Intravenous Every 24 hours 09/06/2013 1334     08/25/2013 1400  azithromycin (ZITHROMAX) 500 mg in dextrose 5 % 250 mL IVPB  Status:  Discontinued     500 mg 250 mL/hr over 60 Minutes Intravenous Every 24 hours 09/15/2013 1334 08/31/2013 1349   09/01/2013 0800  vancomycin (VANCOCIN) IVPB 750 mg/150 ml premix  Status:  Discontinued     750 mg 150 mL/hr over 60 Minutes Intravenous Every 12 hours 09/12/2013 0623 09/11/13 0932   09/18/2013 0330  ceFEPIme (MAXIPIME) 2 g in dextrose 5 % 50 mL IVPB     2 g 100 mL/hr over 30 Minutes Intravenous  Once 09/14/2013 0321 09/17/2013 0415      Subjective:   Cathy Robles seen and examined today.  Patient appears to be comfortable with family at bedside.  Objective:   Filed Vitals:   09/13/13 1242 09/13/13 1300 09/14/13 0602 09/15/13 0500  BP:  124/92 130/80 88/40  Pulse:  75 88 63  Temp: 97.6 F (36.4 C)  97.8 F (36.6 C) 97.3 F (36.3 C)  TempSrc: Oral  Axillary   Resp:  _0 Height:      Weight:      SpO2:  93% 93% 92%    Wt Readings from Last 3 Encounters:  09/13/13 108.4 kg (238 lb 15.7 oz)  03/23/11 97.523 kg (215 lb)  06/02/10 98.884 kg (218 lb)     Intake/Output Summary (Last 24 hours) at 09/15/13 0801 Last data filed at 09/15/13 0500  Gross per 24 hour  Intake    310 ml  Output    750 ml  Net   -440 ml    Exam  General: Well developed, ill appearing   HEENT: NCAT, dry mucous membranes  Neck: Supple, no JVD, no masses  Cardiovascular: S1 S2 auscultated, Regular rate and rhythm.  Respiratory:  coarse breath sounds  Abdomen: Soft, obese, nontender, nondistended, + bowel sounds  Extremities: warm dry without cyanosis clubbing. +edema in UE B/L   Data Review   Micro Results Recent Results (from the past 240 hour(s))  CULTURE, BLOOD (ROUTINE X 2)     Status: None   Collection Time    09/21/2013  3:09 AM  Result Value Ref Range Status   Specimen Description BLOOD CENTRAL LINE   Final   Special Requests BOTTLES DRAWN AEROBIC ONLY RIGHT IJ  [facility]   Final   Culture  Setup Time     Final   Value: 08/31/2013 14:07     Performed at [facility]   Culture     Final   Value: NO GROWTH 5 DAYS     Performed at [facility]   Report Status 09/14/2013 FINAL   Final  MRSA PCR SCREENING     Status: Abnormal   Collection Time    09/14/2013  3:14 AM      Result Value Ref Range Status   MRSA by PCR POSITIVE (*) NEGATIVE Final   Comment:            The GeneXpert MRSA Assay (FDA     approved for NASAL specimens     only), is one component of a     comprehensive MRSA colonization     surveillance program. It is not     intended to diagnose MRSA     infection nor to guide or     monitor treatment for     MRSA infections.     RESULT CALLED TO, READ BACK BY AND VERIFIED WITH:     PARRISH,J RN 0502 09/15/2013 MITCHELL,L  CULTURE, RESPIRATORY (NON-EXPECTORATED)     Status: None   Collection Time    09/15/2013  4:00 AM      Result Value Ref Range Status   Specimen Description TRACHEAL ASPIRATE   Final   Special Requests NONE   Final   Gram Stain     Final   Value: FEW WBC PRESENT,BOTH PMN AND MONONUCLEAR     NO SQUAMOUS EPITHELIAL CELLS SEEN     NO ORGANISMS SEEN     Performed at [facility]   Culture     Final   Value: Non-Pathogenic Oropharyngeal-type Flora Isolated.     Performed at [facility]   Report Status 09/10/2013 FINAL   Final  CULTURE, BLOOD (ROUTINE X 2)     Status: None   Collection Time    09/03/2013 10:38 AM      Result Value Ref Range  Status   Specimen Description BLOOD RIGHT HAND   Final   Special Requests BOTTLES DRAWN AEROBIC ONLY 3CC   Final   Culture  Setup Time     Final   Value: 08/23/2013 22:49     Performed at [facility]   Culture     Final   Value: NO GROWTH 5 DAYS     Performed at [facility]   Report Status 09/14/2013 FINAL   Final  BODY FLUID CULTURE     Status: None   Collection Time    09/09/13  5:12 PM      Result Value Ref Range Status   Specimen Description PLEURAL FLUID RIGHT   Final   Special Requests 17ML FLUID   Final   Gram Stain     Final   Value: MODERATE WBC PRESENT, PREDOMINANTLY PMN     NO ORGANISMS SEEN     Performed at [facility]   Culture     Final   Value: NO GROWTH 3 DAYS     Performed at [facility]   Report Status 09/13/2013 FINAL   Final  BODY FLUID CULTURE     Status: None   Collection Time  09/10/13  2:49 PM      Result Value Ref Range Status   Specimen Description FLUID RIGHT PLEURAL   Final   Special Requests NONE   Final   Gram Stain     Final   Value: NO WBC SEEN     NO ORGANISMS SEEN     Performed at [facility]   Culture     Final   Value: NO GROWTH 3 DAYS     Performed at [facility]   Report Status 09/13/2013 FINAL   Final  CLOSTRIDIUM DIFFICILE BY PCR     Status: Abnormal   Collection Time    09/10/13  6:33 PM      Result Value Ref Range Status   C difficile by pcr POSITIVE (*) NEGATIVE Final   Comment: CRITICAL RESULT CALLED TO, READ BACK BY AND VERIFIED WITH:     Dala Dock RN 10:10 09/11/13 ([email or web address])    Radiology Reports Ct Chest Wo Contrast  09/11/2013   CLINICAL DATA:  Evaluate empyema.  EXAM: CT CHEST WITHOUT CONTRAST  TECHNIQUE: Multidetector CT imaging of the chest was performed following the standard protocol without IV contrast.  COMPARISON:  09/07/2013  FINDINGS: The heart size is moderately enlarged. There is no pericardial effusion. Calcified atherosclerotic disease involves the  thoracic aorta as well as the LAD and RCA coronary arteries. Prominent mediastinal lymph nodes are identified in may be reactive. There is no axillary or supraclavicular adenopathy.  Moderate volume left pleural effusion is identified. A moderate volume of loculated right pleural effusion is also identified. This may represent an empyema. Dense airspace consolidation containing air bronchograms is identified within both lower lobes as well as the right middle lobe. Patchy areas of ground-glass attenuation and airspace consolidation is identified within both upper lobes.  A small amount of perihepatic ascites is noted in the upper abdomen.  IMPRESSION: 1. Moderate volume, loculated right pleural effusion which may represent empyema. 2. Moderate simple appearing left pleural effusion. 3. Bilateral lower lobe airspace consolidation and atelectasis with patchy areas of pneumonitis within both upper lobes. Findings are compatible with multifocal pneumonia. 4. Atherosclerotic disease including multi vessel coronary artery calcifications. 5. Ascites.   Electronically Signed   By: Kerby Moors M.D.   On: 08/28/2013 16:42   Dg Chest Port 1 View  09/13/2013   CLINICAL DATA:  Right-sided chest tube removal  EXAM: PORTABLE CHEST - 1 VIEW  COMPARISON:  09/13/2013  FINDINGS: Right IJ catheter is in stable position, tip at the level of the lower SVC. Interval removal of right basilar pleural drain. No pneumothorax or evidence of increasing pleural fluid.  Unchanged cardiopericardial enlargement. Dense basilar lung opacities are unchanged, with veil like appearance consistent with persistent pleural fluid, left more than right.  IMPRESSION: 1. No pneumothorax or increasing pleural fluid after right chest tube removal. 2. Unchanged multi lobar pneumonia and bilateral pleural effusion.   Electronically Signed   By: Jorje Guild M.D.   On: 09/13/2013 17:38   Dg Chest Port 1 View  09/13/2013   CLINICAL DATA:  Right-sided  empyema  EXAM: PORTABLE CHEST - 1 VIEW  COMPARISON:  09/11/2013  FINDINGS: A drainage catheter is again noted overlying the right lung field. The degree of pleural fluid seen bilaterally is stable. Bibasilar infiltrates are again noted. The overall appearance is stable from the prior study. Increased density is noted over the left hemi thorax which may be related to a posteriorly layering effusion. This may  be positional in nature. A right-sided central venous line is again seen and stable. The endotracheal tube and nasogastric catheter have been removed.  IMPRESSION: Stable bibasilar changes with right chest tube. There appears to be some slight increase in pleural fluid particularly on the left although this may be related to positioning.   Electronically Signed   By: Inez Catalina M.D.   On: 09/13/2013 08:16   Dg Chest Port 1 View  09/11/2013   CLINICAL DATA:  Followup right-sided pleural effusion  EXAM: PORTABLE CHEST - 1 VIEW  COMPARISON:  09/10/2013  FINDINGS: Cardiac shadow remains enlarged. An endotracheal tube, nasogastric catheter right jugular central line are again seen and stable. A right basilar drainage catheter is seen. Persistent likely loculated density is noted within the right lung base. This likely represents a combination of infiltrate and effusion. Stable changes are noted in the left base with infiltrate and effusion. No new focal abnormality is seen.  IMPRESSION: Stable appearance of the chest when compared with the previous day.   Electronically Signed   By: Inez Catalina M.D.   On: 09/11/2013 09:34   Dg Chest Port 1 View  09/10/2013   CLINICAL DATA:  73 year old female with pleural effusion. Lower lobe consolidation. Acute respiratory failure. Initial encounter.  EXAM: PORTABLE CHEST - 1 VIEW  COMPARISON:  09/09/2013 and earlier.  FINDINGS: Portable AP semi upright view at 0610 hrs. New right pigtail pleural catheter in place. Density at the right lung base not significantly changed.  No pneumothorax.  Stable endotracheal tube tip at the level the clavicles. Stable right IJ approach central line tip at the level of the cavoatrial junction. Stable visualized enteric tube.  The patient is now more rotated to the left. Veiling opacity at the left lung base is stable.  IMPRESSION: 1. Right pigtail pleural catheter placed. No pneumothorax. Density at the right lung base not significantly changed. 2.  Otherwise, stable lines and tubes. 3. Stable left lung base density representing a combination of pleural fluid and consolidation, as seen on CT 09/10/2013.   Electronically Signed   By: Lars Pinks M.D.   On: 09/10/2013 07:54   Dg Chest Port 1 View  09/09/2013   CLINICAL DATA:  Pneumonia  EXAM: PORTABLE CHEST - 1 VIEW  COMPARISON:  CT CHEST W/O CM dated 08/25/2013; DG CHEST 1V PORT dated 09/07/2013; DG CHEST 1V PORT dated 09/04/2013  FINDINGS: Grossly unchanged enlarged cardiac silhouette and mediastinal contours given decreased lung volumes and patient rotation to the left. Stable position of support apparatus. The pulmonary vasculature remains indistinct with cephalization lobe. Grossly unchanged small bilateral effusions with associated bilateral mid and lower lung heterogeneous/consolidative opacities. Unchanged bones.  IMPRESSION: 1.  Stable positioning of support apparatus.  No pneumothorax. 2. Grossly unchanged findings of pulmonary edema, small bilateral effusions and associated bibasilar mid and lower lung opacities worrisome for multifocal infection as demonstrated on recent chest CT.   Electronically Signed   By: Sandi Mariscal M.D.   On: 09/09/2013 08:02   Dg Chest Port 1 View  09/12/2013   CLINICAL DATA:  Endotracheal tube advanced 2 cm.  EXAM: PORTABLE CHEST - 1 VIEW  COMPARISON:  Earlier today.  FINDINGS: The endotracheal tube is in satisfactory position and slightly lower than previously demonstrated. The remainder to of the examination is unchanged.  IMPRESSION: No significant change in  probable bilateral pneumonia, pleural effusions and cardiomegaly.   Electronically Signed   By: Enrique Sack M.D.   On: 09/05/2013  03:38   Dg Chest Port 1 View  09/05/2013   CLINICAL DATA:  Line placement.  EXAM: PORTABLE CHEST - 1 VIEW  COMPARISON:  12/30/2009.  FINDINGS: Dense patchy airspace opacity in both mid and lower lung zones. Probable bilateral pleural effusions. Endotracheal tube in satisfactory position. Right jugular catheter tip in the inferior aspect of the superior vena cava. No pneumothorax. Nasogastric tube extending into the stomach. Probable enlarged cardiac silhouette, difficult to assess due to the airspace opacity. Diffuse osteopenia. Stable surgical absence or distortion of the distal clavicles.  IMPRESSION: 1. Dense bilateral airspace opacity, most compatible with pneumonia. 2. Probable bilateral pleural effusions. 3. Probable cardiomegaly.   Electronically Signed   By: Enrique Sack M.D.   On: 09/19/2013 03:33   Ct Image Guided Drainage By Percutaneous Catheter  09/09/2013   CLINICAL DATA:  Respiratory failure, pneumonia, loculated pleural effusion.  EXAM: CT GUIDED RIGHT PLEURAL DRAIN PLACEMENT  ANESTHESIA/SEDATION: Intravenous Fentanyl and Versed were administered as conscious sedation during continuous cardiorespiratory monitoring by the radiology RN, with a total moderate sedation time of 15 minutes.  PROCEDURE: The procedure, risks, benefits, and alternatives were explained to the family. Questions regarding the procedure were encouraged and answered. The family understands and consents to the procedure.  Patient placed in left lateral decubitus positioning. Limited axial scans through the thorax obtained. An appropriate skin entry site was identified.  The right lateral chest wall was prepped with Betadinein a sterile fashion, and a sterile drape was applied covering the operative field. A sterile gown and sterile gloves were used for the procedure. Local anesthesia was provided  with 1% Lidocaine.  Under CT fluoroscopic guidance, a 19 gauge percutaneous entry needle was advanced into the right pleural space. Cloudy greenish 10 fluid returned. Amplatz wire advanced easily, its position confirmed on CT fluoroscopy. Tract was dilated to allow placement of a 14 French pigtail catheter, formed with in the dependent aspect of the effusion. CT confirms appropriate positioning. Approximately 10 mL of slightly cloudy amber fluid were aspirated, sent for routine Gram stain and culture. Catheter was secured externally with 0-Prolene suture and StatLock and placed to a Pleur-Evac at 20 cmH2O suction. The patient tolerated the procedure well.  COMPLICATIONS: None immediate  FINDINGS: Limited scans demonstrate a partially loculated bilateral pleural effusions. Airspace consolidation in both lower lobes with air bronchograms again evident. #14-French pigtail catheter placed in the loculated right pleural effusion.  IMPRESSION: 1. Technically successful CT-guided right pleural drain catheter placement. A sample of the aspirate sent for routine Gram stain and culture.   Electronically Signed   By: Arne Cleveland M.D.   On: 09/09/2013 17:28    CBC  Recent Labs Lab 09/09/13 0400 09/10/13 0444 09/11/13 0420 09/12/13 0340  WBC 20.1* 16.3* 14.3* 17.9*  HGB 11.9* 11.2* 10.4* 10.4*  HCT 37.3 35.8* 33.7* 34.3*  PLT 121* 106* 92* 109*  MCV 91.0 92.7 93.6 94.8  MCH 29.0 29.0 28.9 28.7  MCHC 31.9 31.3 30.9 30.3  RDW 17.0* 17.5* 17.7* 17.6*    Chemistries   Recent Labs Lab 08/29/2013 1435 09/09/13 0400 09/10/13 0444 09/11/13 0420 09/12/13 0340  NA 134* 133* 136* 136* 137  K 4.1 3.6* 3.3* 3.4* 3.3*  CL 99 97 100 100 98  CO2 _0 GLUCOSE 120* 104* 131* 119* 129*  BUN 50* 42* 30* 26* 24*  CREATININE 1.07 0.93 0.73 0.66 0.56  CALCIUM 7.7* 7.8* 7.5* 7.2* 7.3*  MG  --  1.9 1.7  1.6 1.5  AST  --  266* 147* 106* 73*  ALT  --  369* 241* 168* 124*  ALKPHOS  --  91 105 92 77    BILITOT  --  0.7 0.5 0.5 0.5   ------------------------------------------------------------------------------------------------------------------ estimated creatinine clearance is 73 ml/min (by C-G formula based on Cr of 0.56). ------------------------------------------------------------------------------------------------------------------ No results found for this basename: HGBA1C,  in the last 72 hours ------------------------------------------------------------------------------------------------------------------ No results found for this basename: CHOL, HDL, LDLCALC, TRIG, CHOLHDL, LDLDIRECT,  in the last 72 hours ------------------------------------------------------------------------------------------------------------------ No results found for this basename: TSH, T4TOTAL, FREET3, T3FREE, THYROIDAB,  in the last 72 hours ------------------------------------------------------------------------------------------------------------------ No results found for this basename: VITAMINB12, FOLATE, FERRITIN, TIBC, IRON, RETICCTPCT,  in the last 72 hours  Coagulation profile  Recent Labs Lab 09/09/13 0400 09/10/13 0444  INR 1.62* 1.53*    No results found for this basename: DDIMER,  in the last 72 hours  Cardiac Enzymes  Recent Labs Lab 09/07/2013 0907 09/03/2013 1940 09/09/13 0400  TROPONINI 0.70* 0.44* 0.31*   ------------------------------------------------------------------------------------------------------------------ No components found with this basename: POCBNP,     Lurene Robley D.O. on 09/15/2013 at 8:01 AM  Between 7am to 7pm - Pager - [phone]  After 7pm go to [email or web address] - password [email or web address]  And look for the night coverage person covering for me after hours  [facility] Office  [phone]

## 2013-09-23 NOTE — Progress Notes (Signed)
[facility]                                                                              Patient Demographics  Cathy Robles, is a 73 y.o. female, DOB - 01-Apr-1941, YBF:[id number]  Admit date - 09/14/2013   Admitting Physician Doree Fudge, MD  Outpatient Primary MD for the patient is Chevis Pretty, FNP  LOS - 8   No chief complaint on file.  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  Admitted in 2011 with respiratory failure and CHF in setting of AF with RVR. Echo Ef 25%. Underwent cath. Normal coronary arteries EF 40%. Underwent DC-CV. Placed on amio and Pradaxa. Last saw Dr. Percival Spanish in 2012 and was in NSR, dc'd from cardiology due to repeated no-shows.  Daughter came to visit 5/17 and was severely dyspneic and called 911. Taken to [facility]. Was hypotensive (SBP 60s) and in extreme respiratory distress as well as AF with RVR. Intubated in ER. CXR with bilateral infiltrates PNA vs CHF. WBC 23K. BNP 1110 Underwent emergent DC-CV of AF. Started on neo without much benefit. Levophed added in ambulance. Bedside echo LVEF 15-20%. RV normal.    Assessment & Plan   Patient critically ill with multisystem organ failure.  Palliative care has seen patient met with the family, family opted for comfort care. Will continue scopolamine patch, IV fluids, fentanyl drip, Ativan as needed.   Septic shock secondary to community-acquired pneumonia with right empyema/ C. difficile -Patient no longer on pressors -Made comfort care, patient underwent that and chest tube was placed and then removed -Antibiotics discontinued on 5/24 -Patient did require ventilator however extubated and made comfort care  Paroxysmal fibrillation with RVR -Patient underwent emergent DC cardioversion  at [facility] on 09/08/2048 -Was on amiodarone however discontinued  Acute on chronic systolic heart failure/left bundle branch block -EF of 15-20% -Bedside echo: Shows an EF of 15%  Thrombocytopenia -Patient had platelet dropped from 144,000 -Possible HIT, heparin was discontinued  Hypothyroidism -TSH 23 -Was initially placed on Synthroid, however discontinued  Fungal Rash -Continue nystatin and Diflucan  Code Status: DNR/DNI, comfort care  Family Communication: Husband at bedside  Disposition Plan: Admitted  Time Spent in minutes   20 minutes  Procedures/Significant Events  Intubated 09/03/2013  DC-CV of AF at [facility] 08/27/2013  Rt chest tube (IR) 5/18  Duplex LUE neg 5/18  5/20 tpa & pulmozyme into pleural space -drained 260 cc  5/21 palliative care discussion - extubated 5/22 Chest tube removed by IR  Consults   PCCM Cardiothoracic surgery Cardiology Palliative Care Interventional Radiology  DVT Prophylaxis none  Lab Results  Component Value Date   PLT 109* 09/12/2013    Medications  Scheduled Meds: . nystatin cream   Topical TID  . pneumococcal 23 valent vaccine  0.5 mL Intramuscular Tomorrow-1000  . scopolamine  1 patch Transdermal Q72H  . sodium chloride  10-40 mL Intracatheter Q12H   Continuous Infusions: . sodium chloride 10 mL/hr at 09/11/13  1900  . fentaNYL infusion INTRAVENOUS 250 mcg/hr (09-29-2013 0545)   PRN Meds:.atropine, fentaNYL, LORazepam, sodium chloride, white petrolatum  Antibiotics    Anti-infectives   Start     Dose/Rate Route Frequency Ordered Stop   09/13/13 1530  metroNIDAZOLE (FLAGYL) IVPB 500 mg  Status:  Discontinued     500 mg 100 mL/hr over 60 Minutes Intravenous Every 8 hours 09/13/13 1508 09/15/13 0802   09/11/13 1100  metroNIDAZOLE (FLAGYL) tablet 500 mg  Status:  Discontinued     500 mg Oral 3 times per day 09/11/13 1032 09/12/13 1614   09/11/13 1100  vancomycin (VANCOCIN) 50 mg/mL oral solution 125 mg  Status:   Discontinued     125 mg Oral 4 times daily 09/11/13 1032 09/12/13 1614   09/04/2013 1800  ceFEPIme (MAXIPIME) 1 g in dextrose 5 % 50 mL IVPB  Status:  Discontinued     1 g 100 mL/hr over 30 Minutes Intravenous Every 12 hours 09/07/2013 0623 08/29/2013 1334   08/24/2013 1400  cefTRIAXone (ROCEPHIN) 2 g in dextrose 5 % 50 mL IVPB  Status:  Discontinued     2 g 100 mL/hr over 30 Minutes Intravenous Every 24 hours 09/06/2013 1334 09/15/13 0802   08/29/2013 1400  azithromycin (ZITHROMAX) 500 mg in dextrose 5 % 250 mL IVPB  Status:  Discontinued     500 mg 250 mL/hr over 60 Minutes Intravenous Every 24 hours 08/23/2013 1334 09/19/2013 1349   09/18/2013 0800  vancomycin (VANCOCIN) IVPB 750 mg/150 ml premix  Status:  Discontinued     750 mg 150 mL/hr over 60 Minutes Intravenous Every 12 hours 09/05/2013 0623 09/11/13 0932   09/13/2013 0330  ceFEPIme (MAXIPIME) 2 g in dextrose 5 % 50 mL IVPB     2 g 100 mL/hr over 30 Minutes Intravenous  Once 08/24/2013 0321 09/02/2013 0415      Subjective:   Cathy Robles seen and examined today.  Patient appears to be comfortable with family at bedside.  Objective:   Filed Vitals:   09/13/13 1300 09/14/13 0602 09/15/13 0500 Sep 29, 2013 0648  BP: 124/92 130/80 88/40 100/31  Pulse: 75 88 63 50  Temp:  97.8 F (36.6 C) 97.3 F (36.3 C) 96.4 F (35.8 C)  TempSrc:  Axillary  Axillary  Resp: _0 Height:      Weight:      SpO2: 93% 93% 92% 92%    Wt Readings from Last 3 Encounters:  09/13/13 108.4 kg (238 lb 15.7 oz)  03/23/11 97.523 kg (215 lb)  06/02/10 98.884 kg (218 lb)     Intake/Output Summary (Last 24 hours) at 09-29-13 1101 Last data filed at September 29, 2013 8110  Gross per 24 hour  Intake    450 ml  Output    450 ml  Net      0 ml    Exam  General: Well developed, ill appearing   HEENT: NCAT, dry mucous membranes  Neck: Supple, no JVD, no masses  Cardiovascular: S1 S2 auscultated, RRR  Respiratory: coarse breath sounds  Abdomen: Soft, obese,  nontender, nondistended  Extremities: warm dry without cyanosis clubbing. +edema in UE B/L   Data Review   Micro Results Recent Results (from the past 240 hour(s))  CULTURE, BLOOD (ROUTINE X 2)     Status: None   Collection Time    09/15/2013  3:09 AM      Result Value Ref Range Status   Specimen Description BLOOD CENTRAL LINE  Final   Special Requests BOTTLES DRAWN AEROBIC ONLY RIGHT IJ  [facility]   Final   Culture  Setup Time     Final   Value: 09/02/2013 14:07     Performed at [facility]   Culture     Final   Value: NO GROWTH 5 DAYS     Performed at [facility]   Report Status 09/14/2013 FINAL   Final  MRSA PCR SCREENING     Status: Abnormal   Collection Time    09/15/2013  3:14 AM      Result Value Ref Range Status   MRSA by PCR POSITIVE (*) NEGATIVE Final   Comment:            The GeneXpert MRSA Assay (FDA     approved for NASAL specimens     only), is one component of a     comprehensive MRSA colonization     surveillance program. It is not     intended to diagnose MRSA     infection nor to guide or     monitor treatment for     MRSA infections.     RESULT CALLED TO, READ BACK BY AND VERIFIED WITH:     PARRISH,J RN 0502 09/20/2013 MITCHELL,L  CULTURE, RESPIRATORY (NON-EXPECTORATED)     Status: None   Collection Time    09/06/2013  4:00 AM      Result Value Ref Range Status   Specimen Description TRACHEAL ASPIRATE   Final   Special Requests NONE   Final   Gram Stain     Final   Value: FEW WBC PRESENT,BOTH PMN AND MONONUCLEAR     NO SQUAMOUS EPITHELIAL CELLS SEEN     NO ORGANISMS SEEN     Performed at [facility]   Culture     Final   Value: Non-Pathogenic Oropharyngeal-type Flora Isolated.     Performed at [facility]   Report Status 09/10/2013 FINAL   Final  CULTURE, BLOOD (ROUTINE X 2)     Status: None   Collection Time    09/17/2013 10:38 AM      Result Value Ref Range Status   Specimen Description BLOOD RIGHT HAND   Final    Special Requests BOTTLES DRAWN AEROBIC ONLY 3CC   Final   Culture  Setup Time     Final   Value: 09/04/2013 22:49     Performed at [facility]   Culture     Final   Value: NO GROWTH 5 DAYS     Performed at [facility]   Report Status 09/14/2013 FINAL   Final  BODY FLUID CULTURE     Status: None   Collection Time    09/09/13  5:12 PM      Result Value Ref Range Status   Specimen Description PLEURAL FLUID RIGHT   Final   Special Requests 17ML FLUID   Final   Gram Stain     Final   Value: MODERATE WBC PRESENT, PREDOMINANTLY PMN     NO ORGANISMS SEEN     Performed at [facility]   Culture     Final   Value: NO GROWTH 3 DAYS     Performed at [facility]   Report Status 09/13/2013 FINAL   Final  BODY FLUID CULTURE     Status: None   Collection Time    09/10/13  2:49 PM      Result Value  Ref Range Status   Specimen Description FLUID RIGHT PLEURAL   Final   Special Requests NONE   Final   Gram Stain     Final   Value: NO WBC SEEN     NO ORGANISMS SEEN     Performed at [facility]   Culture     Final   Value: NO GROWTH 3 DAYS     Performed at [facility]   Report Status 09/13/2013 FINAL   Final  CLOSTRIDIUM DIFFICILE BY PCR     Status: Abnormal   Collection Time    09/10/13  6:33 PM      Result Value Ref Range Status   C difficile by pcr POSITIVE (*) NEGATIVE Final   Comment: CRITICAL RESULT CALLED TO, READ BACK BY AND VERIFIED WITH:     Dala Dock RN 10:10 09/11/13 ([email or web address])    Radiology Reports Ct Chest Wo Contrast  09/11/2013   CLINICAL DATA:  Evaluate empyema.  EXAM: CT CHEST WITHOUT CONTRAST  TECHNIQUE: Multidetector CT imaging of the chest was performed following the standard protocol without IV contrast.  COMPARISON:  09/14/2013  FINDINGS: The heart size is moderately enlarged. There is no pericardial effusion. Calcified atherosclerotic disease involves the thoracic aorta as well as the LAD and RCA coronary arteries.  Prominent mediastinal lymph nodes are identified in may be reactive. There is no axillary or supraclavicular adenopathy.  Moderate volume left pleural effusion is identified. A moderate volume of loculated right pleural effusion is also identified. This may represent an empyema. Dense airspace consolidation containing air bronchograms is identified within both lower lobes as well as the right middle lobe. Patchy areas of ground-glass attenuation and airspace consolidation is identified within both upper lobes.  A small amount of perihepatic ascites is noted in the upper abdomen.  IMPRESSION: 1. Moderate volume, loculated right pleural effusion which may represent empyema. 2. Moderate simple appearing left pleural effusion. 3. Bilateral lower lobe airspace consolidation and atelectasis with patchy areas of pneumonitis within both upper lobes. Findings are compatible with multifocal pneumonia. 4. Atherosclerotic disease including multi vessel coronary artery calcifications. 5. Ascites.   Electronically Signed   By: Kerby Moors M.D.   On: 08/30/2013 16:42   Dg Chest Port 1 View  09/13/2013   CLINICAL DATA:  Right-sided chest tube removal  EXAM: PORTABLE CHEST - 1 VIEW  COMPARISON:  09/13/2013  FINDINGS: Right IJ catheter is in stable position, tip at the level of the lower SVC. Interval removal of right basilar pleural drain. No pneumothorax or evidence of increasing pleural fluid.  Unchanged cardiopericardial enlargement. Dense basilar lung opacities are unchanged, with veil like appearance consistent with persistent pleural fluid, left more than right.  IMPRESSION: 1. No pneumothorax or increasing pleural fluid after right chest tube removal. 2. Unchanged multi lobar pneumonia and bilateral pleural effusion.   Electronically Signed   By: Jorje Guild M.D.   On: 09/13/2013 17:38   Dg Chest Port 1 View  09/13/2013   CLINICAL DATA:  Right-sided empyema  EXAM: PORTABLE CHEST - 1 VIEW  COMPARISON:  09/11/2013   FINDINGS: A drainage catheter is again noted overlying the right lung field. The degree of pleural fluid seen bilaterally is stable. Bibasilar infiltrates are again noted. The overall appearance is stable from the prior study. Increased density is noted over the left hemi thorax which may be related to a posteriorly layering effusion. This may be positional in nature. A right-sided central venous line is again  seen and stable. The endotracheal tube and nasogastric catheter have been removed.  IMPRESSION: Stable bibasilar changes with right chest tube. There appears to be some slight increase in pleural fluid particularly on the left although this may be related to positioning.   Electronically Signed   By: Inez Catalina M.D.   On: 09/13/2013 08:16   Dg Chest Port 1 View  09/11/2013   CLINICAL DATA:  Followup right-sided pleural effusion  EXAM: PORTABLE CHEST - 1 VIEW  COMPARISON:  09/10/2013  FINDINGS: Cardiac shadow remains enlarged. An endotracheal tube, nasogastric catheter right jugular central line are again seen and stable. A right basilar drainage catheter is seen. Persistent likely loculated density is noted within the right lung base. This likely represents a combination of infiltrate and effusion. Stable changes are noted in the left base with infiltrate and effusion. No new focal abnormality is seen.  IMPRESSION: Stable appearance of the chest when compared with the previous day.   Electronically Signed   By: Inez Catalina M.D.   On: 09/11/2013 09:34   Dg Chest Port 1 View  09/10/2013   CLINICAL DATA:  73 year old female with pleural effusion. Lower lobe consolidation. Acute respiratory failure. Initial encounter.  EXAM: PORTABLE CHEST - 1 VIEW  COMPARISON:  09/09/2013 and earlier.  FINDINGS: Portable AP semi upright view at 0610 hrs. New right pigtail pleural catheter in place. Density at the right lung base not significantly changed. No pneumothorax.  Stable endotracheal tube tip at the level the  clavicles. Stable right IJ approach central line tip at the level of the cavoatrial junction. Stable visualized enteric tube.  The patient is now more rotated to the left. Veiling opacity at the left lung base is stable.  IMPRESSION: 1. Right pigtail pleural catheter placed. No pneumothorax. Density at the right lung base not significantly changed. 2.  Otherwise, stable lines and tubes. 3. Stable left lung base density representing a combination of pleural fluid and consolidation, as seen on CT 09/11/2013.   Electronically Signed   By: Lars Pinks M.D.   On: 09/10/2013 07:54   Dg Chest Port 1 View  09/09/2013   CLINICAL DATA:  Pneumonia  EXAM: PORTABLE CHEST - 1 VIEW  COMPARISON:  CT CHEST W/O CM dated 09/18/2013; DG CHEST 1V PORT dated 09/14/2013; DG CHEST 1V PORT dated 09/10/2013  FINDINGS: Grossly unchanged enlarged cardiac silhouette and mediastinal contours given decreased lung volumes and patient rotation to the left. Stable position of support apparatus. The pulmonary vasculature remains indistinct with cephalization lobe. Grossly unchanged small bilateral effusions with associated bilateral mid and lower lung heterogeneous/consolidative opacities. Unchanged bones.  IMPRESSION: 1.  Stable positioning of support apparatus.  No pneumothorax. 2. Grossly unchanged findings of pulmonary edema, small bilateral effusions and associated bibasilar mid and lower lung opacities worrisome for multifocal infection as demonstrated on recent chest CT.   Electronically Signed   By: Sandi Mariscal M.D.   On: 09/09/2013 08:02   Dg Chest Port 1 View  09/12/2013   CLINICAL DATA:  Endotracheal tube advanced 2 cm.  EXAM: PORTABLE CHEST - 1 VIEW  COMPARISON:  Earlier today.  FINDINGS: The endotracheal tube is in satisfactory position and slightly lower than previously demonstrated. The remainder to of the examination is unchanged.  IMPRESSION: No significant change in probable bilateral pneumonia, pleural effusions and cardiomegaly.    Electronically Signed   By: Enrique Sack M.D.   On: 09/02/2013 03:38   Dg Chest Port 1 View  09/04/2013  CLINICAL DATA:  Line placement.  EXAM: PORTABLE CHEST - 1 VIEW  COMPARISON:  12/30/2009.  FINDINGS: Dense patchy airspace opacity in both mid and lower lung zones. Probable bilateral pleural effusions. Endotracheal tube in satisfactory position. Right jugular catheter tip in the inferior aspect of the superior vena cava. No pneumothorax. Nasogastric tube extending into the stomach. Probable enlarged cardiac silhouette, difficult to assess due to the airspace opacity. Diffuse osteopenia. Stable surgical absence or distortion of the distal clavicles.  IMPRESSION: 1. Dense bilateral airspace opacity, most compatible with pneumonia. 2. Probable bilateral pleural effusions. 3. Probable cardiomegaly.   Electronically Signed   By: Enrique Sack M.D.   On: 08/31/2013 03:33   Ct Image Guided Drainage By Percutaneous Catheter  09/09/2013   CLINICAL DATA:  Respiratory failure, pneumonia, loculated pleural effusion.  EXAM: CT GUIDED RIGHT PLEURAL DRAIN PLACEMENT  ANESTHESIA/SEDATION: Intravenous Fentanyl and Versed were administered as conscious sedation during continuous cardiorespiratory monitoring by the radiology RN, with a total moderate sedation time of 15 minutes.  PROCEDURE: The procedure, risks, benefits, and alternatives were explained to the family. Questions regarding the procedure were encouraged and answered. The family understands and consents to the procedure.  Patient placed in left lateral decubitus positioning. Limited axial scans through the thorax obtained. An appropriate skin entry site was identified.  The right lateral chest wall was prepped with Betadinein a sterile fashion, and a sterile drape was applied covering the operative field. A sterile gown and sterile gloves were used for the procedure. Local anesthesia was provided with 1% Lidocaine.  Under CT fluoroscopic guidance, a 19 gauge  percutaneous entry needle was advanced into the right pleural space. Cloudy greenish 10 fluid returned. Amplatz wire advanced easily, its position confirmed on CT fluoroscopy. Tract was dilated to allow placement of a 14 French pigtail catheter, formed with in the dependent aspect of the effusion. CT confirms appropriate positioning. Approximately 10 mL of slightly cloudy amber fluid were aspirated, sent for routine Gram stain and culture. Catheter was secured externally with 0-Prolene suture and StatLock and placed to a Pleur-Evac at 20 cmH2O suction. The patient tolerated the procedure well.  COMPLICATIONS: None immediate  FINDINGS: Limited scans demonstrate a partially loculated bilateral pleural effusions. Airspace consolidation in both lower lobes with air bronchograms again evident. #14-French pigtail catheter placed in the loculated right pleural effusion.  IMPRESSION: 1. Technically successful CT-guided right pleural drain catheter placement. A sample of the aspirate sent for routine Gram stain and culture.   Electronically Signed   By: Arne Cleveland M.D.   On: 09/09/2013 17:28    CBC  Recent Labs Lab 09/10/13 0444 09/11/13 0420 09/12/13 0340  WBC 16.3* 14.3* 17.9*  HGB 11.2* 10.4* 10.4*  HCT 35.8* 33.7* 34.3*  PLT 106* 92* 109*  MCV 92.7 93.6 94.8  MCH 29.0 28.9 28.7  MCHC 31.3 30.9 30.3  RDW 17.5* 17.7* 17.6*    Chemistries   Recent Labs Lab 09/10/13 0444 09/11/13 0420 09/12/13 0340  NA 136* 136* 137  K 3.3* 3.4* 3.3*  CL 100 100 98  CO2 _0 GLUCOSE 131* 119* 129*  BUN 30* 26* 24*  CREATININE 0.73 0.66 0.56  CALCIUM 7.5* 7.2* 7.3*  MG 1.7 1.6 1.5  AST 147* 106* 73*  ALT 241* 168* 124*  ALKPHOS 105 92 77  BILITOT 0.5 0.5 0.5   ------------------------------------------------------------------------------------------------------------------ estimated creatinine clearance is 73 ml/min (by C-G formula based on Cr of  0.56). ------------------------------------------------------------------------------------------------------------------ No results found for this  basename: HGBA1C,  in the last 72 hours ------------------------------------------------------------------------------------------------------------------ No results found for this basename: CHOL, HDL, LDLCALC, TRIG, CHOLHDL, LDLDIRECT,  in the last 72 hours ------------------------------------------------------------------------------------------------------------------ No results found for this basename: TSH, T4TOTAL, FREET3, T3FREE, THYROIDAB,  in the last 72 hours ------------------------------------------------------------------------------------------------------------------ No results found for this basename: VITAMINB12, FOLATE, FERRITIN, TIBC, IRON, RETICCTPCT,  in the last 72 hours  Coagulation profile  Recent Labs Lab 09/10/13 0444  INR 1.53*    No results found for this basename: DDIMER,  in the last 72 hours  Cardiac Enzymes No results found for this basename: CK, CKMB, TROPONINI, MYOGLOBIN,  in the last 168 hours ------------------------------------------------------------------------------------------------------------------ No components found with this basename: POCBNP,     Raygan Skarda D.O. on 10-11-13 at 11:01 AM  Between 7am to 7pm - Pager - [phone]  After 7pm go to [email or web address] - password [email or web address]  And look for the night coverage person covering for me after hours  [facility] Office  [phone]

## 2013-09-23 NOTE — Progress Notes (Signed)
Patient has no pulse, no respirations, unresponsive, pooling of blood in feet and hands, patient is DNR, next of kin present at time of death which is 11:30 am on 2013/09/20, physician notified, [employer] [facility] also notified.

## 2013-09-23 NOTE — Progress Notes (Signed)
Nutrition Brief Note  Chart reviewed. Pt now transitioning to comfort care. TFs have been discontinued and pt is allowed ice chips for comfort.  No further nutrition interventions warranted at this time.  Please re-consult as needed.   Loyce Dys, MS RD LDN Clinical Inpatient Dietitian Pager: [phone] Weekend/After hours pager: [phone]

## 2013-09-23 NOTE — Progress Notes (Signed)
Patient's body sent to the morgue. 

## 2013-09-23 NOTE — Discharge Summary (Signed)
Death Summary  CORLEY KOHLS ZOX:[id number] DOB: 05-Nov-1940 DOA: 09/19/13  PCP: Chevis Pretty, FNP PCP/Office notified:   Admit date: 19-Sep-2013 Date of Death: Sep 27, 2013  Final Diagnoses:  Principal Problem:   Septic shock(785.52) Active Problems:   Unspecified hypothyroidism   TOBACCO ABUSE   LBBB   Paroxysmal atrial fibrillation   Acute on chronic systolic heart failure   Acute on chronic respiratory failure   Pneumococcal lobar pneumonia   Acute respiratory failure   NSTEMI (non-ST elevated myocardial infarction) likely due to demand ischemia   NICM (nonischemic cardiomyopathy) - cath 2011 with minimal plaque   History of noncompliance with medical treatment, presenting hazards to health   Elevated INR   Transaminitis   Morbid obesity   Palliative care encounter   Pleural effusion   Weakness generalized   Dyspnea   Increased oropharyngeal secretions   History of present illness:  73 y/o obese 1.5 PPD smoker with PAF, CHF due to NICM EF 20%, LBBB, chronic pain syndrome, hypothyroidism and COPD with ongoing tobacco use admitted from [facility] on 19-Sep-2013 for respiratory failure due to bilateral PNA. Admitted in 2011 with respiratory failure and CHF in setting of AF with RVR. Echo Ef 25%. Underwent cath. Normal coronary arteries EF 40%. Underwent DC-CV. Placed on amio and Pradaxa. Last saw Dr. Percival Spanish in 2012 and was in NSR, dc'd from cardiology due to repeated no-shows. Daughter came to visit 09-20-2022 and was severely dyspneic and called 911. Taken to [facility]. Was hypotensive (SBP 60s) and in extreme respiratory distress as well as AF with RVR. Intubated in ER. CXR with bilateral infiltrates PNA vs CHF. WBC 23K. BNP 1110 Underwent emergent DC-CV of AF. Started on neo without much benefit. Levophed added in ambulance. Bedside echo LVEF 15-20%. RV normal.   Hospital Course:  Patient critically ill with multisystem organ failure. Palliative care has seen  patient met with the family, family opted for comfort care. Patient was placed on scopolamine patch, IV fluids, fentanyl drip, Ativan as needed.  Patient passed comfortably on 09/14/2013 at 11:30am.  Septic shock secondary to community-acquired pneumonia with right empyema/ C. difficile  -Patient no longer on pressors  -Made comfort care, patient underwent that and chest tube was placed and then removed  -Antibiotics discontinued on 5/24  -Patient did require ventilator however extubated and made comfort care   Paroxysmal fibrillation with RVR  -Patient underwent emergent DC cardioversion at [facility] on Sep 19, 2048  -Was on amiodarone however discontinued   Acute on chronic systolic heart failure/left bundle branch block  -EF of 15-20%  -Bedside echo: Shows an EF of 15%   Thrombocytopenia  -Patient had platelet dropped from 144,000  -Possible HIT, heparin was discontinued   Hypothyroidism  -TSH 23  -Was initially placed on Synthroid, however discontinued   Fungal Rash  -Discontinued nystatin and Diflucan  Time: 10 minutes  Signed:  Aleyna Cueva  [facility] 09-27-2013, 1:40 PM

## 2013-09-23 NOTE — Progress Notes (Signed)
Progress Note [facility] Pall[facility] Medicine Team [facility]  Subjective:   -continued conversation with family regarding  natural trajectory and expectations at EOL were discussed  (daughter Babette Relicammy Mayford KnifeWilliams is present today along with Link Snufferddie Foutz the patient's husband)   - discussed with family that time is limited, likely hours    Objective: Allergies  Allergen Reactions  . Aspirin     Pt intubated (08/2013) and unable to verbalize reaction, but affirms allergy (so does family)  . Codeine     REACTION: itch with large doses  . Iodine Itching    Felt hot  . Levofloxacin     REACTION: itch  . Meperidine Hcl     REACTION: nausea  . Methadone     REACTION: ? confusion  . Penicillins     REACTION: hives  . Sulfonamide Derivatives     REACTION: nausea,vomiting,itching   Scheduled Meds: . nystatin cream   Topical TID  . pneumococcal 23 valent vaccine  0.5 mL Intramuscular Tomorrow-1000  . scopolamine  1 patch Transdermal Q72H  . sodium chloride  10-40 mL Intracatheter Q12H   Continuous Infusions: . sodium chloride 10 mL/hr at 09/11/13 1900  . fentaNYL infusion INTRAVENOUS 250 mcg/hr (02-25-2014 0545)   PRN Meds:.atropine, fentaNYL, LORazepam, sodium chloride, white petrolatum  BP 100/31  Pulse 50  Temp(Src) 96.4 F (35.8 C) (Axillary)  Resp 20  Ht 5' (1.524 m)  Wt 108.4 kg (238 lb 15.7 oz)  BMI 46.67 kg/m2  SpO2 92%   PPS:10 % at best     Intake/Output Summary (Last 24 hours) at 02-25-2014 0926 Last data filed at 02-25-2014 0649  Gross per 24 hour  Intake    450 ml  Output    450 ml  Net      0 ml      LBM: rectal tube      Physical Exam:  General: transitioning at EOL, prognosis is likely hrs HEENT:  Dry buccal  Membranes, Chest:  scattered coarse BS, audible throat secretions CVS: RRR Abdomen:soft NT decreased BS Ext: BUE with +1 edema Neuro: follows simple commands, more lethargic today  Labs: CBC    Component Value Date/Time   WBC 17.9*  09/12/2013 0340   RBC 3.62* 09/12/2013 0340   HGB 10.4* 09/12/2013 0340   HCT 34.3* 09/12/2013 0340   PLT 109* 09/12/2013 0340   MCV 94.8 09/12/2013 0340   MCH 28.7 09/12/2013 0340   MCHC 30.3 09/12/2013 0340   RDW 17.6* 09/12/2013 0340   LYMPHSABS 1.1 09/09/2013 0300   MONOABS 1.1* 09/15/2013 0300   EOSABS 0.0 09/20/2013 0300   BASOSABS 0.0 09/09/2013 0300    BMET    Component Value Date/Time   NA 137 09/12/2013 0340   K 3.3* 09/12/2013 0340   CL 98 09/12/2013 0340   CO2 28 09/12/2013 0340   GLUCOSE 129* 09/12/2013 0340   BUN 24* 09/12/2013 0340   CREATININE 0.56 09/12/2013 0340   CALCIUM 7.3* 09/12/2013 0340   GFRNONAA >90 09/12/2013 0340   GFRAA >90 09/12/2013 0340    CMP     Component Value Date/Time   NA 137 09/12/2013 0340   K 3.3* 09/12/2013 0340   CL 98 09/12/2013 0340   CO2 28 09/12/2013 0340   GLUCOSE 129* 09/12/2013 0340   BUN 24* 09/12/2013 0340   CREATININE 0.56 09/12/2013 0340   CALCIUM 7.3* 09/12/2013 0340   PROT 5.2* 09/12/2013 0340   ALBUMIN 1.7* 09/12/2013 0340   AST 73* 09/12/2013  0340   ALT 124* 09/12/2013 0340   ALKPHOS 77 09/12/2013 0340   BILITOT 0.5 09/12/2013 0340   GFRNONAA >90 09/12/2013 0340   GFRAA >90 09/12/2013 0340     Assessment and Plan:  1. Code Status:  DNR/DNI-comfort is main focus of care   -Family both in agreement that comfort is main focus of care,  "we just want her to ease on out".    -symptom management -titrate gtt to comfort,    2. Symptom Control: Pain/Dyspnea  Continue continuous  Fentanyl gtt, may titrate to comfort          Agitation  Ativan 1 mg IV every 4 hrs prn          Terminal secretions: Scopolamine patch  3. Psycho/Social:  Emotional support offered to daughter and husband.  Both verbalize understanding of limited prognosis.  Hope is for comfort, quality and dignity  4.  Disposition: Expect hospital death   Patient Documents Completed or Given: Document Given Completed  Advanced Directives Pkt    MOST yes   DNR    Gone  from My Sight    Hard Choices  yes    Time In Time Out Total Time Spent with Patient Total Overall Time  0755 0815  20 min 20 min    Greater than 50%  of this time was spent counseling and coordinating care related to the above assessment and plan.  Lorinda Creed NP  Palliative Medicine Team Team Phone # [phone] Pager [phone]  Discussed with Dr  Catha Gosselin 1

## 2013-09-23 DEATH — deceased

## 2013-10-16 NOTE — Consult Note (Signed)
I have reviewed and discussed the care of this patient in detail with the nurse practitioner including pertinent patient records, physical exam findings and data. I agree with details of this encounter.
